# Patient Record
Sex: Female | Born: 1980 | Race: White | Hispanic: No | Marital: Single | Smoking: Current every day smoker
Health system: Southern US, Community
[De-identification: ages and names within clinical notes are randomized; demographics above are authoritative.]

## PROBLEM LIST (undated history)

## (undated) DIAGNOSIS — J45909 Unspecified asthma, uncomplicated: Secondary | ICD-10-CM

## (undated) DIAGNOSIS — N83209 Unspecified ovarian cyst, unspecified side: Secondary | ICD-10-CM

## (undated) DIAGNOSIS — K219 Gastro-esophageal reflux disease without esophagitis: Secondary | ICD-10-CM

## (undated) DIAGNOSIS — M797 Fibromyalgia: Secondary | ICD-10-CM

## (undated) DIAGNOSIS — F988 Other specified behavioral and emotional disorders with onset usually occurring in childhood and adolescence: Secondary | ICD-10-CM

## (undated) DIAGNOSIS — F419 Anxiety disorder, unspecified: Secondary | ICD-10-CM

## (undated) DIAGNOSIS — M545 Low back pain, unspecified: Secondary | ICD-10-CM

## (undated) DIAGNOSIS — N83519 Torsion of ovary and ovarian pedicle, unspecified side: Secondary | ICD-10-CM

## (undated) DIAGNOSIS — M5030 Other cervical disc degeneration, unspecified cervical region: Secondary | ICD-10-CM

## (undated) HISTORY — DX: Unspecified asthma, uncomplicated: J45.909

## (undated) HISTORY — DX: Unspecified ovarian cyst, unspecified side: N83.209

## (undated) HISTORY — DX: Other specified behavioral and emotional disorders with onset usually occurring in childhood and adolescence: F98.8

## (undated) HISTORY — DX: Other cervical disc degeneration, unspecified cervical region: M50.30

## (undated) HISTORY — PX: OOPHORECTOMY: SHX86

## (undated) HISTORY — PX: HAND SURGERY: SHX662

## (undated) HISTORY — PX: TUBAL LIGATION: SHX77

---

## 2017-05-24 ENCOUNTER — Emergency Department (HOSPITAL_COMMUNITY): Payer: Medicare Other

## 2017-05-24 ENCOUNTER — Encounter (HOSPITAL_COMMUNITY): Payer: Self-pay | Admitting: Emergency Medicine

## 2017-05-24 ENCOUNTER — Emergency Department (HOSPITAL_COMMUNITY)
Admission: EM | Admit: 2017-05-24 | Discharge: 2017-05-24 | Disposition: A | Discharge status: Home / Self Care | Payer: Medicare Other | Attending: Emergency Medicine | Admitting: Emergency Medicine

## 2017-05-24 DIAGNOSIS — N838 Other noninflammatory disorders of ovary, fallopian tube and broad ligament: Secondary | ICD-10-CM | POA: Diagnosis not present

## 2017-05-24 DIAGNOSIS — N760 Acute vaginitis: Secondary | ICD-10-CM | POA: Insufficient documentation

## 2017-05-24 DIAGNOSIS — R1110 Vomiting, unspecified: Secondary | ICD-10-CM | POA: Diagnosis not present

## 2017-05-24 DIAGNOSIS — R1032 Left lower quadrant pain: Secondary | ICD-10-CM | POA: Diagnosis present

## 2017-05-24 DIAGNOSIS — B9689 Other specified bacterial agents as the cause of diseases classified elsewhere: Secondary | ICD-10-CM

## 2017-05-24 DIAGNOSIS — R102 Pelvic and perineal pain: Secondary | ICD-10-CM | POA: Diagnosis not present

## 2017-05-24 DIAGNOSIS — F17210 Nicotine dependence, cigarettes, uncomplicated: Secondary | ICD-10-CM | POA: Diagnosis not present

## 2017-05-24 DIAGNOSIS — N83202 Unspecified ovarian cyst, left side: Secondary | ICD-10-CM

## 2017-05-24 HISTORY — DX: Fibromyalgia: M79.7

## 2017-05-24 HISTORY — DX: Low back pain: M54.5

## 2017-05-24 HISTORY — DX: Anxiety disorder, unspecified: F41.9

## 2017-05-24 HISTORY — DX: Torsion of ovary and ovarian pedicle, unspecified side: N83.519

## 2017-05-24 HISTORY — DX: Low back pain, unspecified: M54.50

## 2017-05-24 LAB — COMPREHENSIVE METABOLIC PANEL
ALK PHOS: 51 U/L (ref 38–126)
ALT: 41 U/L (ref 14–54)
ANION GAP: 8 (ref 5–15)
AST: 34 U/L (ref 15–41)
Albumin: 3.9 g/dL (ref 3.5–5.0)
BUN: 13 mg/dL (ref 6–20)
CALCIUM: 8.9 mg/dL (ref 8.9–10.3)
CO2: 23 mmol/L (ref 22–32)
Chloride: 102 mmol/L (ref 101–111)
Creatinine, Ser: 0.8 mg/dL (ref 0.44–1.00)
GFR calc non Af Amer: >60 mL/min (ref >60)
Glucose, Bld: 97 mg/dL (ref 65–99)
Potassium: 3.7 mmol/L (ref 3.5–5.1)
SODIUM: 133 mmol/L — AB (ref 135–145)
Total Bilirubin: 0.8 mg/dL (ref 0.3–1.2)
Total Protein: 6.7 g/dL (ref 6.5–8.1)

## 2017-05-24 LAB — CBC WITH DIFFERENTIAL/PLATELET
BASOS PCT: 0 %
Basophils Absolute: 0 10*3/uL (ref 0.0–0.1)
EOS ABS: 0.2 10*3/uL (ref 0.0–0.7)
Eosinophils Relative: 3 %
HCT: 43.8 % (ref 36.0–46.0)
HEMOGLOBIN: 14.8 g/dL (ref 12.0–15.0)
LYMPHS ABS: 1.2 10*3/uL (ref 0.7–4.0)
Lymphocytes Relative: 14 %
MCH: 30.2 pg (ref 26.0–34.0)
MCHC: 33.8 g/dL (ref 30.0–36.0)
MCV: 89.4 fL (ref 78.0–100.0)
Monocytes Absolute: 0.4 10*3/uL (ref 0.1–1.0)
Monocytes Relative: 5 %
NEUTROS PCT: 78 %
Neutro Abs: 6.3 10*3/uL (ref 1.7–7.7)
Platelets: 236 10*3/uL (ref 150–400)
RBC: 4.9 MIL/uL (ref 3.87–5.11)
RDW: 14.4 % (ref 11.5–15.5)
WBC: 8.2 10*3/uL (ref 4.0–10.5)

## 2017-05-24 LAB — URINALYSIS, ROUTINE W REFLEX MICROSCOPIC
Bacteria, UA: NONE SEEN
Bilirubin Urine: NEGATIVE
GLUCOSE, UA: NEGATIVE mg/dL
Ketones, ur: NEGATIVE mg/dL
LEUKOCYTES UA: NEGATIVE
NITRITE: NEGATIVE
PH: 5 (ref 5.0–8.0)
PROTEIN: NEGATIVE mg/dL
SPECIFIC GRAVITY, URINE: 1.014 (ref 1.005–1.030)

## 2017-05-24 LAB — PREGNANCY, URINE: Preg Test, Ur: NEGATIVE

## 2017-05-24 LAB — WET PREP, GENITAL
Sperm: NONE SEEN
TRICH WET PREP: NONE SEEN
Yeast Wet Prep HPF POC: NONE SEEN

## 2017-05-24 LAB — LIPASE, BLOOD: Lipase: 21 U/L (ref 11–51)

## 2017-05-24 MED ORDER — ONDANSETRON 4 MG PO TBDP: 4.0000 mg | ORAL_TABLET | Freq: Three times a day (TID) | ORAL | 0 refills | Status: DC | PRN

## 2017-05-24 MED ORDER — FENTANYL CITRATE (PF) 100 MCG/2ML IJ SOLN
50.0000 ug | INTRAMUSCULAR | Status: AC | PRN
  Administered 2017-05-24 (×2): 50 ug via INTRAVENOUS
  Filled 2017-05-24 (×2): qty 2

## 2017-05-24 MED ORDER — HYDROCODONE-ACETAMINOPHEN 5-325 MG PO TABS: ORAL_TABLET | ORAL | 0 refills | Status: DC

## 2017-05-24 MED ORDER — ONDANSETRON HCL 4 MG/2ML IJ SOLN
4.0000 mg | INTRAMUSCULAR | Status: DC | PRN
  Administered 2017-05-24: 4 mg via INTRAVENOUS
  Filled 2017-05-24: qty 2

## 2017-05-24 MED ORDER — NAPROXEN 250 MG PO TABS: 250.0000 mg | ORAL_TABLET | Freq: Two times a day (BID) | ORAL | 0 refills | Status: DC | PRN

## 2017-05-24 MED ORDER — METRONIDAZOLE 500 MG PO TABS: 500.0000 mg | ORAL_TABLET | Freq: Two times a day (BID) | ORAL | 0 refills | Status: DC

## 2017-05-24 NOTE — ED Provider Notes (Signed)
[facility] DEPT Provider Note   CSN: [id number] Arrival date & time: 05/24/17  1223     History   Chief Complaint Chief Complaint  Patient presents with  . Abdominal Pain    HPI Emily Vasquez is a 36 y.o. female.   Abdominal Pain      Pt was seen at 1230.  Per pt, c/o gradual onset and persistence of constant left sided abd "pain" since yesterday.  Has been associated with multiple intermittent episodes of N/V.  Describes the abd pain as "sharp," with radiation into her left lower back.  Denies N/V, no diarrhea, no fevers, hematuria/dysuria, no vaginal bleeding/discharge, no rash, no CP/SOB, no black or blood in stools or emesis.      Past Medical History:  Diagnosis Date  . Anxiety   . Fibromyalgia   . Low back pain   . Ovarian torsion     There are no active problems to display for this patient.   Past Surgical History:  Procedure Laterality Date  . CESAREAN SECTION    . HAND SURGERY    . OOPHORECTOMY      OB History    No data available       Home Medications    Prior to Admission medications   Medication Sig Start Date End Date Taking? Authorizing Provider  clonazePAM (KLONOPIN) 0.5 MG tablet Take 0.5 mg by mouth 4 (four) times daily. 05/11/17  Yes [provider]  fluticasone (FLONASE) 50 MCG/ACT nasal spray Place 2 sprays into both nostrils daily.   Yes [provider]  gabapentin (NEURONTIN) 300 MG capsule Take 1 capsule by mouth 4 (four) times daily. 04/29/17  Yes [provider]  gabapentin (NEURONTIN) 600 MG tablet Take 600 mg by mouth 4 (four) times daily. 04/29/17  Yes [provider]  methylphenidate (RITALIN LA) 10 MG 24 hr capsule Take 10 mg by mouth daily.   Yes [provider]  omeprazole (PRILOSEC) 40 MG capsule Take 40 mg by mouth daily.   Yes [provider]  topiramate (TOPAMAX) 100 MG tablet Take 100 mg by mouth at bedtime. 05/07/17  Yes [provider]    Family  History History reviewed. No pertinent family history.  Social History Social History  Substance Use Topics  . Smoking status: Current Every Day Smoker    Types: Cigarettes  . Smokeless tobacco: Never Used  . Alcohol use Yes     Comment: occasional     Allergies   Compazine [prochlorperazine maleate]   Review of Systems Review of Systems  Gastrointestinal: Positive for abdominal pain.   ROS: Statement: All systems negative except as marked or noted in the HPI; Constitutional: Negative for fever and chills. ; ; Eyes: Negative for eye pain, redness and discharge. ; ; ENMT: Negative for ear pain, hoarseness, nasal congestion, sinus pressure and sore throat. ; ; Cardiovascular: Negative for chest pain, palpitations, diaphoresis, dyspnea and peripheral edema. ; ; Respiratory: Negative for cough, wheezing and stridor. ; ; Gastrointestinal: +N/V, abd pain. Negative for diarrhea, blood in stool, hematemesis, jaundice and rectal bleeding. . ; ; Genitourinary: Negative for dysuria, flank pain and hematuria. ; ; GYN:  No vaginal bleeding, no vaginal discharge, no vulvar pain. ;; Musculoskeletal: Negative for back pain and neck pain. Negative for swelling and trauma.; ; Skin: Negative for pruritus, rash, abrasions, blisters, bruising and skin lesion.; ; Neuro: Negative for headache, lightheadedness and neck stiffness. Negative for weakness, altered level of consciousness, altered mental status, extremity weakness,  paresthesias, involuntary movement, seizure and syncope.       Physical Exam Updated Vital Signs BP 121/79 (BP Location: Right Arm)   Pulse 67   Temp 98.3 F (36.8 C) (Oral)   Resp 18   Ht 5\' 11"  (1.803 m)   Wt 77.1 kg (170 lb)   LMP 05/11/2017 Comment: neg u preg  SpO2 100%   BMI 23.71 kg/m   Physical Exam 1235: Physical examination:  Nursing notes reviewed; Vital signs and O2 SAT reviewed;  Constitutional: Well developed, Well nourished, Well hydrated, In no acute distress;  Head:  Normocephalic, atraumatic; Eyes: EOMI, PERRL, No scleral icterus; ENMT: Mouth and pharynx normal, Mucous membranes moist; Neck: Supple, Full range of motion, No lymphadenopathy; Cardiovascular: Regular rate and rhythm, No gallop; Respiratory: Breath sounds clear & equal bilaterally, No wheezes.  Speaking full sentences with ease, Normal respiratory effort/excursion; Chest: Nontender, Movement normal; Abdomen: Soft, +LLQ tender to palp. No rebound or guarding. Nondistended, Normal bowel sounds; Genitourinary: No CVA tenderness. Pelvic exam performed with permission of pt and female ED tech assist during exam.  External genitalia w/o lesions. Vaginal vault with thick white discharge, no blood.  Cervix w/o lesions, not friable, GC/chlam and wet prep obtained and sent to lab.  Bimanual exam w/o CMT, uterine or right adnexal tenderness. +mild left pelvic tenderness.;;; Spine:  No midline CS, TS, LS tenderness.;; Extremities: Pulses normal, No tenderness, No edema, No calf edema or asymmetry.; Neuro: AA&Ox3, Major CN grossly intact.  Speech clear. No gross focal motor or sensory deficits in extremities.; Skin: Color normal, Warm, Dry.   ED Treatments / Results  Labs (all labs ordered are listed, but only abnormal results are displayed)   EKG  EKG Interpretation None       Radiology   Procedures Procedures (including critical care time)  Medications Ordered in ED Medications  fentaNYL (SUBLIMAZE) injection 50 mcg (50 mcg Intravenous Given 05/24/17 1348)  ondansetron (ZOFRAN) injection 4 mg (not administered)     Initial Impression / Assessment and Plan / ED Course  I have reviewed the triage vital signs and the nursing notes.  Pertinent labs & imaging results that were available during my care of the patient were reviewed by me and considered in my medical decision making (see chart for details).  MDM Reviewed: nursing note and vitals Interpretation: labs, ultrasound and CT  scan   Results for orders placed or performed during the hospital encounter of 05/24/17  Wet prep, genital  Result Value Ref Range   Yeast Wet Prep HPF POC NONE SEEN NONE SEEN   Trich, Wet Prep NONE SEEN NONE SEEN   Clue Cells Wet Prep HPF POC PRESENT (A) NONE SEEN   WBC, Wet Prep HPF POC FEW (A) NONE SEEN   Sperm NONE SEEN   Comprehensive metabolic panel  Result Value Ref Range   Sodium 133 (L) 135 - 145 mmol/L   Potassium 3.7 3.5 - 5.1 mmol/L   Chloride 102 101 - 111 mmol/L   CO2 23 22 - 32 mmol/L   Glucose, Bld 97 65 - 99 mg/dL   BUN 13 6 - 20 mg/dL   Creatinine, Ser 0.80 0.44 - 1.00 mg/dL   Calcium 8.9 8.9 - 10.3 mg/dL   Total Protein 6.7 6.5 - 8.1 g/dL   Albumin 3.9 3.5 - 5.0 g/dL   AST 34 15 - 41 U/L   ALT 41 14 - 54 U/L   Alkaline Phosphatase 51 38 - 126 U/L   Total  Bilirubin 0.8 0.3 - 1.2 mg/dL   GFR calc non Af Amer >60 >60 mL/min   GFR calc Af Amer >60 >60 mL/min   Anion gap 8 5 - 15  Lipase, blood  Result Value Ref Range   Lipase 21 11 - 51 U/L  CBC with Differential  Result Value Ref Range   WBC 8.2 4.0 - 10.5 K/uL   RBC 4.90 3.87 - 5.11 MIL/uL   Hemoglobin 14.8 12.0 - 15.0 g/dL   HCT 43.8 36.0 - 46.0 %   MCV 89.4 78.0 - 100.0 fL   MCH 30.2 26.0 - 34.0 pg   MCHC 33.8 30.0 - 36.0 g/dL   RDW 14.4 11.5 - 15.5 %   Platelets 236 150 - 400 K/uL   Neutrophils Relative % 78 %   Neutro Abs 6.3 1.7 - 7.7 K/uL   Lymphocytes Relative 14 %   Lymphs Abs 1.2 0.7 - 4.0 K/uL   Monocytes Relative 5 %   Monocytes Absolute 0.4 0.1 - 1.0 K/uL   Eosinophils Relative 3 %   Eosinophils Absolute 0.2 0.0 - 0.7 K/uL   Basophils Relative 0 %   Basophils Absolute 0.0 0.0 - 0.1 K/uL  Urinalysis, Routine w reflex microscopic  Result Value Ref Range   Color, Urine YELLOW YELLOW   APPearance HAZY (A) CLEAR   Specific Gravity, Urine 1.014 1.005 - 1.030   pH 5.0 5.0 - 8.0   Glucose, UA NEGATIVE NEGATIVE mg/dL   Hgb urine dipstick MODERATE (A) NEGATIVE   Bilirubin Urine NEGATIVE  NEGATIVE   Ketones, ur NEGATIVE NEGATIVE mg/dL   Protein, ur NEGATIVE NEGATIVE mg/dL   Nitrite NEGATIVE NEGATIVE   Leukocytes, UA NEGATIVE NEGATIVE   RBC / HPF 6-30 0 - 5 RBC/hpf   WBC, UA 0-5 0 - 5 WBC/hpf   Bacteria, UA NONE SEEN NONE SEEN   Squamous Epithelial / LPF 0-5 (A) NONE SEEN   Mucous PRESENT   Pregnancy, urine  Result Value Ref Range   Preg Test, Ur NEGATIVE NEGATIVE   Ct Renal Stone Study Result Date: 05/24/2017 CLINICAL DATA:  36 year old female with history of left lower abdominal pain radiating into the back. Vomiting since yesterday evening. EXAM: CT ABDOMEN AND PELVIS WITHOUT CONTRAST TECHNIQUE: Multidetector CT imaging of the abdomen and pelvis was performed following the standard protocol without IV contrast. COMPARISON:  No priors. FINDINGS: Lower chest: Unremarkable. Hepatobiliary: Severe diffuse low attenuation throughout the hepatic parenchyma, indicative of severe hepatic steatosis. No discrete cystic or solid hepatic lesions are confidently identified on today's noncontrast CT examination. Unenhanced appearance of the gallbladder is normal. Pancreas: No definite pancreatic mass or peripancreatic inflammatory changes are noted on today's noncontrast CT examination. Spleen: Unremarkable. Adrenals/Urinary Tract: There are no abnormal calcifications within the collecting system of either kidney, along the course of either ureter, or within the lumen of the urinary bladder. No hydroureteronephrosis or perinephric stranding to suggest urinary tract obstruction at this time. The unenhanced appearance of the kidneys is unremarkable bilaterally. Unenhanced appearance of the urinary bladder is normal. Unenhanced appearance of the adrenal glands bilaterally is normal. Stomach/Bowel: Unenhanced appearance of the stomach is normal. There is no pathologic dilatation of small bowel or colon. Normal appendix. Vascular/Lymphatic: Minimal atherosclerotic calcifications are noted in the  abdominal aorta and pelvic vasculature, without definite aneurysms. No lymphadenopathy noted in the abdomen or pelvis on today's noncontrast CT examination. Reproductive: Uterus and right ovary are unremarkable in appearance. Mass-like appearance of the left adnexa which measures approximately  8.6 x 5.5 x 6.6 cm (axial image 66 of series 3 and coronal image 70 of series 6), which appears to contain several internal lesions which do not appear simple on today's noncontrast CT examination, largest of which measures approximately 3.0 x 3.5 x 4.4 cm (axial image 69 of series 3 and coronal image 76 of series 6). Other: Trace volume of free fluid in the cul-de-sac, presumably physiologic in this young female patient. No significant volume of ascites. No pneumoperitoneum. Musculoskeletal: There are no aggressive appearing lytic or blastic lesions noted in the visualized portions of the skeleton. IMPRESSION: 1. No acute findings are noted in the abdomen or pelvis to account for the patient's symptoms. 2. Mass-like enlargement of the left adnexa which appears to contain several ovarian lesions, some of which do not appear simple on today's noncontrast CT examination. Today's study is limited, and further evaluation with pelvic ultrasound is strongly recommended in the near future to better evaluate these findings and exclude underlying ovarian neoplasm or acute abnormality such as ovarian torsion. 3. Severe hepatic steatosis. Electronically Signed   By: Vinnie Langton M.D.   On: 05/24/2017 14:14   US Transvaginal Non-ob Result Date: 05/24/2017 CLINICAL DATA:  36 year old female with history of left lower quadrant abdominal pain and nausea and vomiting since yesterday evening. EXAM: TRANSABDOMINAL AND TRANSVAGINAL ULTRASOUND OF PELVIS DOPPLER ULTRASOUND OF OVARIES TECHNIQUE: Both transabdominal and transvaginal ultrasound examinations of the pelvis were performed. Transabdominal technique was performed for global imaging  of the pelvis including uterus, ovaries, adnexal regions, and pelvic cul-de-sac. It was necessary to proceed with endovaginal exam following the transabdominal exam to visualize the ovaries. Color and duplex Doppler ultrasound was utilized to evaluate blood flow to the ovaries. COMPARISON:  None. FINDINGS: Uterus Measurements: 9.1 x 7.3 x 7.1 cm. No fibroids or other mass visualized. Endometrium Thickness: 8 mm.  No focal abnormality visualized. Right ovary Status post right oophorectomy. Left ovary Measurements: 9.0 x 4.9 x 3.9 cm. Diffusely heterogeneous with multiple internal areas of heterogeneous echogenicity, likely to reflect the presence of several independent lesions, several of which are solid in appearance, but poorly evaluated on today's examination. One of these lesions measures 2.5 cm and demonstrates several thick internal septations. Pulsed Doppler evaluation of the left ovary demonstrate normal low-resistance arterial and venous waveforms. Other findings Trace volume of free fluid, likely physiologic. IMPRESSION: 1. Enlarged and heterogeneous appearing left ovary with multiple complex lesions, many of which appear partially solid or have thick internal septations. Findings are concerning for potential neoplasm and could be better evaluated with followup nonemergent MRI of the pelvis with and without IV gadolinium. 2. At this time, there is no evidence for left ovarian torsion. 3. Status post right oophorectomy. Electronically Signed   By: Vinnie Langton M.D.   On: 05/24/2017 15:52    1555:  CT and f/u US as above; no torsion or acute surgical issue at this time. Tx for BV and f/u OB/GYN MD. Dx and testing d/w pt.  Questions answered.  Verb understanding, agreeable to d/c home with outpt f/u.       Final Clinical Impressions(s) / ED Diagnoses   Final diagnoses:  Pelvic pain  Adnexal cyst  Bacterial vaginosis    New Prescriptions New Prescriptions   No medications on file       Francine Graven, DO 05/28/17 1533

## 2017-05-24 NOTE — ED Triage Notes (Signed)
Per ems, lower left abdominal pain that spreads to back. Vomiting since last night. Given 30 mg of toradol and 4 mg of zofran.

## 2017-05-24 NOTE — ED Notes (Signed)
Pt states her pain is increasing, but states she will let me know when she needs another dose of pain medication.  Call light in reach.

## 2017-05-24 NOTE — Discharge Instructions (Signed)
Take the prescriptions as directed. Your pelvic ultrasound showed: "Enlarged and heterogeneous appearing left ovary with multiple complex lesions. Findings could be better evaluated with followup nonemergent MRI of the pelvis with and without IV gadolinium."  Call the OB/GYN doctor tomorrow to schedule a follow up appointment this week for these findings.  Return to the Emergency Department immediately sooner if worsening.

## 2017-05-25 LAB — GC/CHLAMYDIA PROBE AMP (~~LOC~~) NOT AT ARMC
CHLAMYDIA, DNA PROBE: NEGATIVE
Neisseria Gonorrhea: NEGATIVE

## 2017-05-28 ENCOUNTER — Ambulatory Visit (INDEPENDENT_AMBULATORY_CARE_PROVIDER_SITE_OTHER): Payer: Medicare Other | Admitting: Adult Health

## 2017-05-28 ENCOUNTER — Encounter: Payer: Self-pay | Admitting: Adult Health

## 2017-05-28 VITALS — BP 122/64 | HR 98 | Ht 70.5 in | Wt 181.5 lb

## 2017-05-28 DIAGNOSIS — N838 Other noninflammatory disorders of ovary, fallopian tube and broad ligament: Secondary | ICD-10-CM

## 2017-05-28 DIAGNOSIS — R102 Pelvic and perineal pain: Secondary | ICD-10-CM | POA: Insufficient documentation

## 2017-05-28 DIAGNOSIS — N83299 Other ovarian cyst, unspecified side: Secondary | ICD-10-CM | POA: Diagnosis not present

## 2017-05-28 MED ORDER — KETOROLAC TROMETHAMINE 10 MG PO TABS: 10.0000 mg | ORAL_TABLET | Freq: Four times a day (QID) | ORAL | 0 refills | Status: DC | PRN

## 2017-05-28 NOTE — Progress Notes (Signed)
Subjective:     Patient ID: Emily Vasquez, female   DOB: 1981-01-18, 36 y.o.   MRN: [id number]  HPI Cheyne is a 36 year old white female in for ER follow up was seen in ER at [facility] for pelvic pain esp LLQ, started 8/11, is having bleeding now.Has right ovarian torsion and had ovary removed last year.Has 71 year old son, and can't play with him, due to pain.She says she has fibromyalgia, DJD and arthritis and is disabled. She recently moved here from [place] but is from [place].She says now has pressure and feels like contractions.  Review of Systems  +pelvic pain,esp LLQ +pressure +bleeding  Reviewed past medical,surgical, social and family history. Reviewed medications and allergies.     Objective:   Physical Exam BP 122/64 (BP Location: Left Arm, Patient Position: Sitting, Cuff Size: Normal)   Pulse 98   Ht 5' 10.5" (1.791 m)   Wt 181 lb 8 oz (82.3 kg)   LMP 05/11/2017 Comment: neg u preg  BMI 25.67 kg/m  PHQ 2 score 1. Skin warm and dry.Pelvic: external genitalia is normal in appearance no lesions, vagina:+ period like blood ,urethra has no lesions or masses noted, cervix:smooth and bulbous, no CMT noted, uterus: normal size, shape and contour, mildly tender, no masses felt, adnexa: no masses, LLQ tenderness noted. Bladder is non tender and no masses felt.  Had negative GC/CHL in ER 05/24/17 and negative UPT. US showed:Marland Kitchen Enlarged and heterogeneous appearing left ovary with multiple complex lesions, many of which appear partially solid or have thick internal septations.  2. At this time, there is no evidence for left ovarian torsion. 3. Status post right oophorectomy.   Discussed with Dr Elonda Husky, and he reviewed [place] films and he will see next week, to get CA 125.She is aware that she may need surgical intervention, will Rx Toradol for pain.   Assessment:     1. Complex ovarian cyst   2. Enlarged ovary   3. Pelvic pain       Plan:     Check CA 125, in am(has to get money  together) Return in 4 days for pre op with Dr Elonda Husky Rx toradol 10 mg #20 take 1 every 6 hours prn pain, no refills If pain changes or increase go to ER

## 2017-05-28 NOTE — Patient Instructions (Addendum)
Return in 4 days to see Dr Elonda Husky  Get CA 125 in am

## 2017-05-29 ENCOUNTER — Other Ambulatory Visit: Payer: Self-pay | Admitting: Adult Health

## 2017-05-30 LAB — CA 125: Cancer Antigen (CA) 125: 19.5 U/mL (ref 0.0–38.1)

## 2017-06-01 ENCOUNTER — Telehealth: Payer: Self-pay | Admitting: Adult Health

## 2017-06-01 ENCOUNTER — Encounter: Payer: Self-pay | Admitting: Obstetrics & Gynecology

## 2017-06-01 ENCOUNTER — Ambulatory Visit (INDEPENDENT_AMBULATORY_CARE_PROVIDER_SITE_OTHER): Payer: Medicare Other | Admitting: Obstetrics & Gynecology

## 2017-06-01 VITALS — BP 122/80 | HR 87 | Ht 70.5 in | Wt 180.0 lb

## 2017-06-01 DIAGNOSIS — C562 Malignant neoplasm of left ovary: Secondary | ICD-10-CM | POA: Diagnosis not present

## 2017-06-01 DIAGNOSIS — R102 Pelvic and perineal pain: Secondary | ICD-10-CM | POA: Diagnosis not present

## 2017-06-01 DIAGNOSIS — N838 Other noninflammatory disorders of ovary, fallopian tube and broad ligament: Secondary | ICD-10-CM

## 2017-06-01 DIAGNOSIS — N839 Noninflammatory disorder of ovary, fallopian tube and broad ligament, unspecified: Secondary | ICD-10-CM | POA: Diagnosis not present

## 2017-06-01 DIAGNOSIS — N83299 Other ovarian cyst, unspecified side: Secondary | ICD-10-CM

## 2017-06-01 NOTE — Telephone Encounter (Signed)
Pt aware that CA 125 is 19.5, no cancer.

## 2017-06-01 NOTE — Progress Notes (Signed)
Preoperative History and Physical    Preoperative History and Physical  Emily Vasquez is a 36 y.o. [id number] with No LMP recorded. admitted for a laparoscopic left salpingo oophorectomy for persistent increasingly painful left ovarian complex mass.  CA 125 is normal, pt elects for removal, has a history of right ovarian torsion last with subsequent removal  PMH:      Past Medical History:  Diagnosis Date  . ADD (attention deficit disorder)   . Anxiety   . Asthma   . Degenerative disc disease, cervical   . Fibromyalgia   . GERD (gastroesophageal reflux disease)   . Low back pain   . Ovarian cyst   . Ovarian torsion    PSH:       Past Surgical History:  Procedure Laterality Date  . CESAREAN SECTION    . HAND SURGERY    . OOPHORECTOMY    . TUBAL LIGATION     POb/GynH:          OB History     Gravida Para Term Preterm AB Living   2 2 2   2     SAB TAB Ectopic Multiple Live Births       2      SH:         Social History  Substance Use Topics  . Smoking status: Current Every Day Smoker    Packs/day: 0.50    Years: 26.00    Types: Cigarettes  . Smokeless tobacco: Never Used  . Alcohol use Yes      Comment: occasional   FH:       Family History  Problem Relation Age of Onset  . Alcohol abuse Paternal Grandfather   . Mental illness Maternal Grandmother   . Diabetes Maternal Grandmother   . Heart attack Maternal Grandfather   . Heart attack Father   . High Cholesterol Father   . Diabetes Brother   . Stroke Brother   . Asthma Daughter   . Asthma Son    Allergies:       Allergies  Allergen Reactions  . Compazine [Prochlorperazine Maleate] Other (See Comments)    Nausea and muscle spasms   Medications:  Current Facility-Administered Medications:  . bupivacaine liposome (EXPAREL) 1.3 % injection 266 mg, 20 mL, Infiltration, Once, Meerab Maselli H, MD  . ceFAZolin (ANCEF) IVPB 2g/100 mL premix, 2 g, Intravenous, On Call to OR, Florian Buff, MD  .  lactated ringers infusion, , Intravenous, Continuous, Lerry Liner, MD  Review of Systems:  Review of Systems  Constitutional: Negative for fever, chills, weight loss, malaise/fatigue and diaphoresis.  HENT: Negative for hearing loss, ear pain, nosebleeds, congestion, sore throat, neck pain, tinnitus and ear discharge.  Eyes: Negative for blurred vision, double vision, photophobia, pain, discharge and redness.  Respiratory: Negative for cough, hemoptysis, sputum production, shortness of breath, wheezing and stridor.  Cardiovascular: Negative for chest pain, palpitations, orthopnea, claudication, leg swelling and PND.  Gastrointestinal: Positive for abdominal pain. Negative for heartburn, nausea, vomiting, diarrhea, constipation, blood in stool and melena.  Genitourinary: Negative for dysuria, urgency, frequency, hematuria and flank pain.  Musculoskeletal: Negative for myalgias, back pain, joint pain and falls.  Skin: Negative for itching and rash.  Neurological: Negative for dizziness, tingling, tremors, sensory change, speech change, focal weakness, seizures, loss of consciousness, weakness and headaches.  Endo/Heme/Allergies: Negative for environmental allergies and polydipsia. Does not bruise/bleed easily.  Psychiatric/Behavioral: Negative for depression, suicidal ideas, hallucinations, memory loss and substance abuse. The patient is  not nervous/anxious and does not have insomnia.  PHYSICAL EXAM:  Blood pressure (!) 104/51, temperature 98.2 F (36.8 C), resp. rate (!) 77, SpO2 92 %.  Vitals reviewed.  Constitutional: She is oriented to person, place, and time. She appears well-developed and well-nourished.  HENT:  Head: Normocephalic and atraumatic.  Right Ear: External ear normal.  Left Ear: External ear normal.  Nose: Nose normal.  Mouth/Throat: Oropharynx is clear and moist.  Eyes: Conjunctivae and EOM are normal. Pupils are equal, round, and reactive to light. Right eye exhibits no  discharge. Left eye exhibits no discharge. No scleral icterus.  Neck: Normal range of motion. Neck supple. No tracheal deviation present. No thyromegaly present.  Cardiovascular: Normal rate, regular rhythm, normal heart sounds and intact distal pulses. Exam reveals no gallop and no friction rub.  No murmur heard.  Respiratory: Effort normal and breath sounds normal. No respiratory distress. She has no wheezes. She has no rales. She exhibits no tenderness.  GI: Soft. Bowel sounds are normal. She exhibits no distension and no mass. There is tenderness. There is no rebound and no guarding.  Genitourinary:  Vulva is normal without lesions Vagina is pink moist without discharge Cervix normal in appearance and pap is normal Uterus is normal size, contour, position, consistency, mobility, non-tender Adnexa is per sonogram Musculoskeletal: Normal range of motion. She exhibits no edema and no tenderness.  Neurological: She is alert and oriented to person, place, and time. She has normal reflexes. She displays normal reflexes. No cranial nerve deficit. She exhibits normal muscle tone. Coordination normal.  Skin: Skin is warm and dry. No rash noted. No erythema. No pallor.  Psychiatric: She has a normal mood and affect. Her behavior is normal. Judgment and thought content normal.  Labs:        Results for orders placed or performed during the hospital encounter of 06/12/17 (from the past 336 hour(s))  CBC   Collection Time: 06/12/17 2:09 PM  Result Value Ref Range   WBC 8.5 4.0 - 10.5 K/uL   RBC 4.68 3.87 - 5.11 MIL/uL   Hemoglobin 14.1 12.0 - 15.0 g/dL   HCT 41.8 36.0 - 46.0 %   MCV 89.3 78.0 - 100.0 fL   MCH 30.1 26.0 - 34.0 pg   MCHC 33.7 30.0 - 36.0 g/dL   RDW 14.1 11.5 - 15.5 %   Platelets 395 150 - 400 K/uL  Comprehensive metabolic panel   Collection Time: 06/12/17 2:09 PM  Result Value Ref Range   Sodium 140 135 - 145 mmol/L   Potassium 4.1 3.5 - 5.1 mmol/L   Chloride 109 101 - 111  mmol/L   CO2 21 (L) 22 - 32 mmol/L   Glucose, Bld 94 65 - 99 mg/dL   BUN 9 6 - 20 mg/dL   Creatinine, Ser 0.87 0.44 - 1.00 mg/dL   Calcium 9.4 8.9 - 10.3 mg/dL   Total Protein 7.0 6.5 - 8.1 g/dL   Albumin 4.1 3.5 - 5.0 g/dL   AST 33 15 - 41 U/L   ALT 39 14 - 54 U/L   Alkaline Phosphatase 63 38 - 126 U/L   Total Bilirubin 0.3 0.3 - 1.2 mg/dL   GFR calc non Af Amer >60 >60 mL/min   GFR calc Af Amer >60 >60 mL/min   Anion gap 10 5 - 15  hCG, quantitative, pregnancy   Collection Time: 06/12/17 2:09 PM  Result Value Ref Range   hCG, Beta Chain, Quant, S 1 <5  mIU/mL  Rapid HIV screen (HIV 1/2 Ab+Ag)   Collection Time: 06/12/17 2:09 PM  Result Value Ref Range   HIV-1 P24 Antigen - HIV24 NON REACTIVE NON REACTIVE   HIV 1/2 Antibodies NON REACTIVE NON REACTIVE   Interpretation (HIV Ag Ab)      A non reactive test result means that HIV 1 or HIV 2 antibodies and HIV 1 p24 antigen were not detected in the specimen.  Urinalysis, Routine w reflex microscopic   Collection Time: 06/12/17 2:09 PM  Result Value Ref Range   Color, Urine YELLOW YELLOW   APPearance HAZY (A) CLEAR   Specific Gravity, Urine 1.017 1.005 - 1.030   pH 5.0 5.0 - 8.0   Glucose, UA NEGATIVE NEGATIVE mg/dL   Hgb urine dipstick NEGATIVE NEGATIVE   Bilirubin Urine NEGATIVE NEGATIVE   Ketones, ur NEGATIVE NEGATIVE mg/dL   Protein, ur NEGATIVE NEGATIVE mg/dL   Nitrite NEGATIVE NEGATIVE   Leukocytes, UA SMALL (A) NEGATIVE   RBC / HPF 0-5 0 - 5 RBC/hpf   WBC, UA 6-30 0 - 5 WBC/hpf   Bacteria, UA RARE (A) NONE SEEN   Squamous Epithelial / LPF 0-5 (A) NONE SEEN  Type and screen   Collection Time: 06/12/17 2:09 PM  Result Value Ref Range   ABO/RH(D) O POS    Antibody Screen NEG    Sample Expiration 06/26/2017    Extend sample reason NO TRANSFUSIONS OR PREGNANCY IN THE PAST 3 MONTHS    EKG:  No orders found for this or any previous visit.  Imaging Studies:   Imaging Results     Assessment:      Patient Active  Problem List   Diagnosis Date Noted  . Complex ovarian cyst 05/28/2017  . Enlarged ovary 05/28/2017  . Pelvic pain 05/28/2017  Hx of right ovarian torsion, s/p removal last year  Normal CA 125  Plan:  Laparoscopic left salpingoophorectomy  Pt understands the risks of surgery including but not limited t excessive bleeding requiring transfusion or reoperation, post-operative infection requiring prolonged hospitalization or re-hospitalization and antibiotic therapy, and damage to other organs including bladder, bowel, ureters and major vessels. The patient also understands the alternative treatment options which were discussed in full. All questions were answered.

## 2017-06-03 ENCOUNTER — Encounter: Payer: Self-pay | Admitting: Obstetrics and Gynecology

## 2017-06-05 NOTE — Patient Instructions (Signed)
Emily Vasquez  06/05/2017     @PREFPERIOPPHARMACY @   Your procedure is scheduled on  06/17/2017 .  Report to [facility] at  950  A.M.  Call this number if you have problems the morning of surgery:  [phone]   Remember:  Do not eat food or drink liquids after midnight.  Take these medicines the morning of surgery with A SIP OF WATER  Klonopin, neurontin, toradol, ritalin, prilosec.   Do not wear jewelry, make-up or nail polish.  Do not wear lotions, powders, or perfumes, or deoderant.  Do not shave 48 hours prior to surgery.  Men may shave face and neck.  Do not bring valuables to the hospital.  [facility] is not responsible for any belongings or valuables.  Contacts, dentures or bridgework may not be worn into surgery.  Leave your suitcase in the car.  After surgery it may be brought to your room.  For patients admitted to the hospital, discharge time will be determined by your treatment team.  Patients discharged the day of surgery will not be allowed to drive home.   Name and phone number of your driver:   family Special instructions:  None  Please read over the following fact sheets that you were given. Anesthesia Post-op Instructions and Care and Recovery After Surgery       Unilateral Salpingo-Oophorectomy Unilateral salpingo-oophorectomy is the surgical removal of one fallopian tube and ovary. The ovaries are small organs that produce eggs in women. The fallopian tubes transport the egg from the ovary to the womb (uterus). A unilateral salpingo-oophorectomy may be done for various reasons, including:  Infection in the fallopian tube and ovary.  Scar tissue in the fallopian tube and ovary (adhesions).  A cyst or tumor on the ovary.  A need to remove the fallopian tube and ovary when removing the uterus.  Cancer of the fallopian tube or ovary.  The removal of one fallopian tube and ovary will not prevent you from becoming pregnant, put  you into menopause, or cause problems with your menstrual periods or sex drive. Tell a health care provider about:  Any allergies you have.  All medicines you are taking, including vitamins, herbs, eye drops, creams, and over-the-counter medicines.  Previous problems you or family members have had with the use of anesthetics.  Any blood disorders you have.  Previous surgeries you have had.  Any medical conditions you have. What are the risks? Generally, this is a safe procedure. However, as with any procedure, complications can occur. Possible complications include:  Injury to surrounding organs.  Bleeding.  Infection.  Blood clots in the legs or lungs.  Problems related to anesthesia.  What happens before the procedure?  Ask your health care provider about changing or stopping your regular medicines. You may need to stop taking certain medicines, such as aspirin or blood thinners, at least 1 week before the surgery.  Do not eat or drink anything for at least 8 hours before the surgery.  If you smoke, do not smoke for at least 2 weeks before the surgery.  Make plans to have someone drive you home after the procedure or after your hospital stay. Also arrange for someone to help you with activities during recovery. What happens during the procedure?  You will be given medicine to help you relax before the procedure (sedative). You will then be given medicine to make you sleep through the procedure (  general anesthetic). These medicines will be given through an IV access tube that is put into one of your veins.  Once you are asleep, your lower abdomen will be shaved and cleaned. A thin, flexible tube (catheter) will be placed in your bladder.  The surgeon may use a laparoscopic, robotic, or open technique for this surgery: ? In the laparoscopic technique, the surgery is done through two small cuts (incisions) in the abdomen. A thin, lighted tube with a tiny camera on the end  (laparoscope) is inserted into one of the incisions. The tools needed for the procedure are put through the other incision. ? A robotic technique may be chosen to perform complex surgery in a small space. In the robotic technique, small incisions are made. A camera and surgical instruments are passed through the incisions. Surgical instruments are controlled with the help of a robotic arm. ? In the open technique, the surgery is done through one large incision in the abdomen.  Using any of these techniques, the surgeon will remove the fallopian tube and ovary. The blood vessels will be clamped and tied.  The surgeon will then use staples or stitches to close the incision or incisions. What happens after the procedure?  You will be taken to a recovery area where your progress will be monitored for 1-3 hours. Your blood pressure, pulse, and temperature will be checked often. You will remain in the recovery area until you are stable and waking up.  If the laparoscopic technique was used, you may be allowed to go home after several hours. You may have some shoulder pain. This is normal and usually goes away in a day or two.  If the open technique was used, you will be admitted to the hospital for a couple of days.  You will be given pain medicine as necessary.  The IV tube and catheter will be removed before you are discharged. This information is not intended to replace advice given to you by your health care provider. Make sure you discuss any questions you have with your health care provider. Document Released: 07/27/2009 Document Revised: 08/29/2016 Document Reviewed: 03/23/2013 Elsevier Interactive Patient Education  2018 [place]. Unilateral Salpingo-Oophorectomy, Care After Refer to this sheet in the next few weeks. These instructions provide you with information on caring for yourself after your procedure. Your health care provider may also give you more specific instructions. Your  treatment has been planned according to current medical practices, but problems sometimes occur. Call your health care provider if you have any problems or questions after your procedure. What can I expect after the procedure? After the procedure, it is typical to have the following:  Abdominal pain that can be controlled with pain medicine.  Vaginal spotting.  Constipation.  Follow these instructions at home:  Get plenty of rest and sleep.  Only take over-the-counter or prescription medicines as directed by your health care provider. Do not take aspirin. It can cause bleeding.  Keep incision areas clean and dry. Remove or change any bandages (dressings) only as directed by your health care provider.  Follow your health care provider's advice regarding diet.  Drink enough fluids to keep your urine clear or pale yellow.  Limit exercise and activities as directed by your health care provider. Do not lift anything heavier than 5 pounds (2.3 kg) until your health care provider approves.  Do not drive until your health care provider approves.  Do not drink alcohol until your health care provider approves.  Do not have sexual intercourse until your health care provider says it is OK.  Take your temperature twice a day and write it down.  If you become constipated, you may: ? Ask your health care provider about taking a mild laxative. ? Add more fruit and bran to your diet. ? Drink more fluids.  Follow up with your health care provider as directed. Contact a health care provider if:  You have swelling or redness in the incision area.  You develop a rash.  You feel lightheaded.  You have pain that is not controlled with medicine.  You have pain, swelling, or redness where the IV access tube was placed. Get help right away if:  You have a fever.  You develop increasing abdominal pain.  You see pus coming out of the incision, or the incision is separating.  You notice a  bad smell coming from the wound or dressing.  You have excessive vaginal bleeding.  You feel sick to your stomach (nauseous) and vomit.  You have leg or chest pain.  You have pain when you urinate.  You develop shortness of breath.  You pass out. This information is not intended to replace advice given to you by your health care provider. Make sure you discuss any questions you have with your health care provider. Document Released: 07/26/2009 Document Revised: 08/29/2016 Document Reviewed: 03/23/2013 Elsevier Interactive Patient Education  2018 [employer].  Diagnostic Laparoscopy, Care After Refer to this sheet in the next few weeks. These instructions provide you with information about caring for yourself after your procedure. Your health care provider may also give you more specific instructions. Your treatment has been planned according to current medical practices, but problems sometimes occur. Call your health care provider if you have any problems or questions after your procedure. What can I expect after the procedure? After your procedure, it is common to have mild discomfort in the throat and abdomen. Follow these instructions at home:  Take over-the-counter and prescription medicines only as told by your health care provider.  Do not drive for 24 hours if you received a sedative.  Return to your normal activities as told by your health care provider.  Do not take baths, swim, or use a hot tub until your health care provider approves. You may shower.  Follow instructions from your health care provider about how to take care of your incision. Make sure you: ? Wash your hands with soap and water before you change your bandage (dressing). If soap and water are not available, use hand sanitizer. ? Change your dressing as told by your health care provider. ? Leave stitches (sutures), skin glue, or adhesive strips in place. These skin closures may need to stay in place for 2  weeks or longer. If adhesive strip edges start to loosen and curl up, you may trim the loose edges. Do not remove adhesive strips completely unless your health care provider tells you to do that.  Check your incision area every day for signs of infection. Check for: ? More redness, swelling, or pain. ? More fluid or blood. ? Warmth. ? Pus or a bad smell.  It is your responsibility to get the results of your procedure. Ask your health care provider or the department performing the procedure when your results will be ready. Contact a health care provider if:  There is new pain in your shoulders.  You feel light-headed or faint.  You are unable to pass gas or unable to  have a bowel movement.  You feel nauseous or you vomit.  You develop a rash.  You have more redness, swelling, or pain around your incision.  You have more fluid or blood coming from your incision.  Your incision feels warm to the touch.  You have pus or a bad smell coming from your incision.  You have a fever or chills. Get help right away if:  Your pain is getting worse.  You have ongoing vomiting.  The edges of your incision open up.  You have trouble breathing.  You have chest pain. This information is not intended to replace advice given to you by your health care provider. Make sure you discuss any questions you have with your health care provider. Document Released: 09/10/2015 Document Revised: 03/06/2016 Document Reviewed: 06/12/2015 Elsevier Interactive Patient Education  2018 [place] Anesthesia, Adult General anesthesia is the use of medicines to make a person "go to sleep" (be unconscious) for a medical procedure. General anesthesia is often recommended when a procedure:  Is long.  Requires you to be still or in an unusual position.  Is major and can cause you to lose blood.  Is impossible to do without general anesthesia.  The medicines used for general anesthesia are  called general anesthetics. In addition to making you sleep, the medicines:  Prevent pain.  Control your blood pressure.  Relax your muscles.  Tell a health care provider about:  Any allergies you have.  All medicines you are taking, including vitamins, herbs, eye drops, creams, and over-the-counter medicines.  Any problems you or family members have had with anesthetic medicines.  Types of anesthetics you have had in the past.  Any bleeding disorders you have.  Any surgeries you have had.  Any medical conditions you have.  Any history of heart or lung conditions, such as heart failure, sleep apnea, or chronic obstructive pulmonary disease (COPD).  Whether you are pregnant or may be pregnant.  Whether you use tobacco, alcohol, marijuana, or street drugs.  Any history of Armed forces logistics/support/administrative officer.  Any history of depression or anxiety. What are the risks? Generally, this is a safe procedure. However, problems may occur, including:  Allergic reaction to anesthetics.  Lung and heart problems.  Inhaling food or liquids from your stomach into your lungs (aspiration).  Injury to nerves.  Waking up during your procedure and being unable to move (rare).  Extreme agitation or a state of mental confusion (delirium) when you wake up from the anesthetic.  Air in the bloodstream, which can lead to stroke.  These problems are more likely to develop if you are having a major surgery or if you have an advanced medical condition. You can prevent some of these complications by answering all of your health care provider's questions thoroughly and by following all pre-procedure instructions. General anesthesia can cause side effects, including:  Nausea or vomiting  A sore throat from the breathing tube.  Feeling cold or shivery.  Feeling tired, washed out, or achy.  Sleepiness or drowsiness.  Confusion or agitation.  What happens before the procedure? Staying hydrated Follow  instructions from your health care provider about hydration, which may include:  Up to 2 hours before the procedure - you may continue to drink clear liquids, such as water, clear fruit juice, black coffee, and plain tea.  Eating and drinking restrictions Follow instructions from your health care provider about eating and drinking, which may include:  8 hours before the procedure - stop  eating heavy meals or foods such as meat, fried foods, or fatty foods.  6 hours before the procedure - stop eating light meals or foods, such as toast or cereal.  6 hours before the procedure - stop drinking milk or drinks that contain milk.  2 hours before the procedure - stop drinking clear liquids.  Medicines  Ask your health care provider about: ? Changing or stopping your regular medicines. This is especially important if you are taking diabetes medicines or blood thinners. ? Taking medicines such as aspirin and ibuprofen. These medicines can thin your blood. Do not take these medicines before your procedure if your health care provider instructs you not to. ? Taking new dietary supplements or medicines. Do not take these during the week before your procedure unless your health care provider approves them.  If you are told to take a medicine or to continue taking a medicine on the day of the procedure, take the medicine with sips of water. General instructions   Ask if you will be going home the same day, the following day, or after a longer hospital stay. ? Plan to have someone take you home. ? Plan to have someone stay with you for the first 24 hours after you leave the hospital or clinic.  For 3-6 weeks before the procedure, try not to use any tobacco products, such as cigarettes, chewing tobacco, and e-cigarettes.  You may brush your teeth on the morning of the procedure, but make sure to spit out the toothpaste. What happens during the procedure?  You will be given anesthetics through a  mask and through an IV tube in one of your veins.  You may receive medicine to help you relax (sedative).  As soon as you are asleep, a breathing tube may be used to help you breathe.  An anesthesia specialist will stay with you throughout the procedure. He or she will help keep you comfortable and safe by continuing to give you medicines and adjusting the amount of medicine that you get. He or she will also watch your blood pressure, pulse, and oxygen levels to make sure that the anesthetics do not cause any problems.  If a breathing tube was used to help you breathe, it will be removed before you wake up. The procedure may vary among health care providers and hospitals. What happens after the procedure?  You will wake up, often slowly, after the procedure is complete, usually in a recovery area.  Your blood pressure, heart rate, breathing rate, and blood oxygen level will be monitored until the medicines you were given have worn off.  You may be given medicine to help you calm down if you feel anxious or agitated.  If you will be going home the same day, your health care provider may check to make sure you can stand, drink, and urinate.  Your health care providers will treat your pain and side effects before you go home.  Do not drive for 24 hours if you received a sedative.  You may: ? Feel nauseous and vomit. ? Have a sore throat. ? Have mental slowness. ? Feel cold or shivery. ? Feel sleepy. ? Feel tired. ? Feel sore or achy, even in parts of your body where you did not have surgery. This information is not intended to replace advice given to you by your health care provider. Make sure you discuss any questions you have with your health care provider. Document Released: 01/06/2008 Document Revised: 03/11/2016 Document Reviewed:  09/13/2015 Elsevier Interactive Patient Education  2018 [place] Anesthesia, Adult, Care After These instructions provide you with  information about caring for yourself after your procedure. Your health care provider may also give you more specific instructions. Your treatment has been planned according to current medical practices, but problems sometimes occur. Call your health care provider if you have any problems or questions after your procedure. What can I expect after the procedure? After the procedure, it is common to have:  Vomiting.  A sore throat.  Mental slowness.  It is common to feel:  Nauseous.  Cold or shivery.  Sleepy.  Tired.  Sore or achy, even in parts of your body where you did not have surgery.  Follow these instructions at home: For at least 24 hours after the procedure:  Do not: ? Participate in activities where you could fall or become injured. ? Drive. ? Use heavy machinery. ? Drink alcohol. ? Take sleeping pills or medicines that cause drowsiness. ? Make important decisions or sign legal documents. ? Take care of children on your own.  Rest. Eating and drinking  If you vomit, drink water, juice, or soup when you can drink without vomiting.  Drink enough fluid to keep your urine clear or pale yellow.  Make sure you have little or no nausea before eating solid foods.  Follow the diet recommended by your health care provider. General instructions  Have a responsible adult stay with you until you are awake and alert.  Return to your normal activities as told by your health care provider. Ask your health care provider what activities are safe for you.  Take over-the-counter and prescription medicines only as told by your health care provider.  If you smoke, do not smoke without supervision.  Keep all follow-up visits as told by your health care provider. This is important. Contact a health care provider if:  You continue to have nausea or vomiting at home, and medicines are not helpful.  You cannot drink fluids or start eating again.  You cannot urinate after  8-12 hours.  You develop a skin rash.  You have fever.  You have increasing redness at the site of your procedure. Get help right away if:  You have difficulty breathing.  You have chest pain.  You have unexpected bleeding.  You feel that you are having a life-threatening or urgent problem. This information is not intended to replace advice given to you by your health care provider. Make sure you discuss any questions you have with your health care provider. Document Released: 01/05/2001 Document Revised: 03/03/2016 Document Reviewed: 09/13/2015 Elsevier Interactive Patient Education  [employer].

## 2017-06-10 ENCOUNTER — Other Ambulatory Visit: Payer: Self-pay | Admitting: Obstetrics & Gynecology

## 2017-06-10 ENCOUNTER — Encounter (HOSPITAL_COMMUNITY)
Admission: RE | Admit: 2017-06-10 | Discharge: 2017-06-10 | Disposition: A | Discharge status: Home / Self Care | Payer: Medicare Other | Source: Ambulatory Visit | Attending: Obstetrics & Gynecology | Admitting: Obstetrics & Gynecology

## 2017-06-10 DIAGNOSIS — N838 Other noninflammatory disorders of ovary, fallopian tube and broad ligament: Secondary | ICD-10-CM | POA: Insufficient documentation

## 2017-06-10 DIAGNOSIS — Z01812 Encounter for preprocedural laboratory examination: Secondary | ICD-10-CM | POA: Insufficient documentation

## 2017-06-10 DIAGNOSIS — N83299 Other ovarian cyst, unspecified side: Secondary | ICD-10-CM | POA: Insufficient documentation

## 2017-06-10 DIAGNOSIS — R102 Pelvic and perineal pain: Secondary | ICD-10-CM | POA: Insufficient documentation

## 2017-06-12 ENCOUNTER — Encounter (HOSPITAL_COMMUNITY)
Admission: RE | Admit: 2017-06-12 | Discharge: 2017-06-12 | Disposition: A | Discharge status: Home / Self Care | Payer: Medicare Other | Source: Ambulatory Visit | Attending: Obstetrics & Gynecology | Admitting: Obstetrics & Gynecology

## 2017-06-12 ENCOUNTER — Encounter (HOSPITAL_COMMUNITY): Payer: Self-pay

## 2017-06-12 DIAGNOSIS — N83299 Other ovarian cyst, unspecified side: Secondary | ICD-10-CM | POA: Diagnosis not present

## 2017-06-12 DIAGNOSIS — Z01812 Encounter for preprocedural laboratory examination: Secondary | ICD-10-CM | POA: Diagnosis present

## 2017-06-12 DIAGNOSIS — N838 Other noninflammatory disorders of ovary, fallopian tube and broad ligament: Secondary | ICD-10-CM | POA: Diagnosis not present

## 2017-06-12 DIAGNOSIS — R102 Pelvic and perineal pain: Secondary | ICD-10-CM | POA: Diagnosis not present

## 2017-06-12 HISTORY — DX: Gastro-esophageal reflux disease without esophagitis: K21.9

## 2017-06-12 LAB — COMPREHENSIVE METABOLIC PANEL
ALBUMIN: 4.1 g/dL (ref 3.5–5.0)
ALK PHOS: 63 U/L (ref 38–126)
ALT: 39 U/L (ref 14–54)
AST: 33 U/L (ref 15–41)
Anion gap: 10 (ref 5–15)
BILIRUBIN TOTAL: 0.3 mg/dL (ref 0.3–1.2)
BUN: 9 mg/dL (ref 6–20)
CALCIUM: 9.4 mg/dL (ref 8.9–10.3)
CO2: 21 mmol/L — AB (ref 22–32)
CREATININE: 0.87 mg/dL (ref 0.44–1.00)
Chloride: 109 mmol/L (ref 101–111)
GFR calc Af Amer: >60 mL/min (ref >60)
GFR calc non Af Amer: >60 mL/min (ref >60)
Glucose, Bld: 94 mg/dL (ref 65–99)
Potassium: 4.1 mmol/L (ref 3.5–5.1)
SODIUM: 140 mmol/L (ref 135–145)
TOTAL PROTEIN: 7 g/dL (ref 6.5–8.1)

## 2017-06-12 LAB — HCG, QUANTITATIVE, PREGNANCY: hCG, Beta Chain, Quant, S: 1 m[IU]/mL (ref <5)

## 2017-06-12 LAB — RAPID HIV SCREEN (HIV 1/2 AB+AG)
HIV 1/2 Antibodies: NONREACTIVE
HIV-1 P24 Antigen - HIV24: NONREACTIVE

## 2017-06-12 LAB — URINALYSIS, ROUTINE W REFLEX MICROSCOPIC
BILIRUBIN URINE: NEGATIVE
GLUCOSE, UA: NEGATIVE mg/dL
HGB URINE DIPSTICK: NEGATIVE
Ketones, ur: NEGATIVE mg/dL
NITRITE: NEGATIVE
PH: 5 (ref 5.0–8.0)
Protein, ur: NEGATIVE mg/dL
SPECIFIC GRAVITY, URINE: 1.017 (ref 1.005–1.030)

## 2017-06-12 LAB — CBC
HCT: 41.8 % (ref 36.0–46.0)
HEMOGLOBIN: 14.1 g/dL (ref 12.0–15.0)
MCH: 30.1 pg (ref 26.0–34.0)
MCHC: 33.7 g/dL (ref 30.0–36.0)
MCV: 89.3 fL (ref 78.0–100.0)
Platelets: 395 10*3/uL (ref 150–400)
RBC: 4.68 MIL/uL (ref 3.87–5.11)
RDW: 14.1 % (ref 11.5–15.5)
WBC: 8.5 10*3/uL (ref 4.0–10.5)

## 2017-06-13 LAB — TYPE AND SCREEN
ABO/RH(D): O POS
ANTIBODY SCREEN: NEGATIVE

## 2017-06-17 ENCOUNTER — Encounter (HOSPITAL_COMMUNITY): Payer: Self-pay | Admitting: Anesthesiology

## 2017-06-17 ENCOUNTER — Ambulatory Visit (HOSPITAL_COMMUNITY): Payer: Medicare Other | Admitting: Anesthesiology

## 2017-06-17 ENCOUNTER — Inpatient Hospital Stay (HOSPITAL_COMMUNITY)
Admission: RE | Admit: 2017-06-17 | Discharge: 2017-06-18 | DRG: 743 | Disposition: A | Discharge status: Home / Self Care | Payer: Medicare Other | Source: Ambulatory Visit | Attending: Obstetrics & Gynecology | Admitting: Obstetrics & Gynecology

## 2017-06-17 ENCOUNTER — Encounter (HOSPITAL_COMMUNITY)
Admission: RE | Disposition: A | Discharge status: Home / Self Care | Payer: Self-pay | Source: Ambulatory Visit | Attending: Obstetrics & Gynecology

## 2017-06-17 DIAGNOSIS — N83299 Other ovarian cyst, unspecified side: Secondary | ICD-10-CM | POA: Diagnosis present

## 2017-06-17 DIAGNOSIS — N839 Noninflammatory disorder of ovary, fallopian tube and broad ligament, unspecified: Principal | ICD-10-CM | POA: Diagnosis present

## 2017-06-17 DIAGNOSIS — N7092 Oophoritis, unspecified: Secondary | ICD-10-CM

## 2017-06-17 DIAGNOSIS — N941 Unspecified dyspareunia: Secondary | ICD-10-CM | POA: Diagnosis present

## 2017-06-17 DIAGNOSIS — K660 Peritoneal adhesions (postprocedural) (postinfection): Secondary | ICD-10-CM | POA: Diagnosis present

## 2017-06-17 DIAGNOSIS — J45909 Unspecified asthma, uncomplicated: Secondary | ICD-10-CM | POA: Diagnosis present

## 2017-06-17 DIAGNOSIS — N7093 Salpingitis and oophoritis, unspecified: Secondary | ICD-10-CM | POA: Diagnosis present

## 2017-06-17 DIAGNOSIS — Z90721 Acquired absence of ovaries, unilateral: Secondary | ICD-10-CM

## 2017-06-17 DIAGNOSIS — Z8742 Personal history of other diseases of the female genital tract: Secondary | ICD-10-CM | POA: Diagnosis not present

## 2017-06-17 DIAGNOSIS — Z5331 Laparoscopic surgical procedure converted to open procedure: Secondary | ICD-10-CM

## 2017-06-17 DIAGNOSIS — Z888 Allergy status to other drugs, medicaments and biological substances status: Secondary | ICD-10-CM

## 2017-06-17 DIAGNOSIS — N72 Inflammatory disease of cervix uteri: Secondary | ICD-10-CM | POA: Diagnosis not present

## 2017-06-17 DIAGNOSIS — Z98891 History of uterine scar from previous surgery: Secondary | ICD-10-CM

## 2017-06-17 DIAGNOSIS — N83202 Unspecified ovarian cyst, left side: Secondary | ICD-10-CM

## 2017-06-17 DIAGNOSIS — N840 Polyp of corpus uteri: Secondary | ICD-10-CM | POA: Diagnosis not present

## 2017-06-17 DIAGNOSIS — F17210 Nicotine dependence, cigarettes, uncomplicated: Secondary | ICD-10-CM | POA: Diagnosis present

## 2017-06-17 DIAGNOSIS — D390 Neoplasm of uncertain behavior of uterus: Secondary | ICD-10-CM

## 2017-06-17 DIAGNOSIS — Z90710 Acquired absence of both cervix and uterus: Secondary | ICD-10-CM | POA: Diagnosis present

## 2017-06-17 DIAGNOSIS — N879 Dysplasia of cervix uteri, unspecified: Secondary | ICD-10-CM | POA: Diagnosis not present

## 2017-06-17 DIAGNOSIS — N7013 Chronic salpingitis and oophoritis: Secondary | ICD-10-CM

## 2017-06-17 HISTORY — PX: ABDOMINAL HYSTERECTOMY: SHX81

## 2017-06-17 HISTORY — PX: LAPAROSCOPIC SALPINGO OOPHERECTOMY: SHX5927

## 2017-06-17 SURGERY — SALPINGO-OOPHORECTOMY, LAPAROSCOPIC
Anesthesia: General | Site: Abdomen | Laterality: Left

## 2017-06-17 MED ORDER — FENTANYL CITRATE (PF) 100 MCG/2ML IJ SOLN: 25.0000 ug | INTRAMUSCULAR | Status: DC | PRN

## 2017-06-17 MED ORDER — FENTANYL CITRATE (PF) 250 MCG/5ML IJ SOLN
INTRAMUSCULAR | Status: AC
  Filled 2017-06-17: qty 5

## 2017-06-17 MED ORDER — ROCURONIUM 10MG/ML (10ML) SYRINGE FOR MEDFUSION PUMP - OPTIME: INTRAVENOUS | Status: DC | PRN

## 2017-06-17 MED ORDER — GLYCOPYRROLATE 0.2 MG/ML IJ SOLN
INTRAMUSCULAR | Status: DC | PRN
  Administered 2017-06-17: 0.6 mg via INTRAVENOUS

## 2017-06-17 MED ORDER — CLONAZEPAM 0.5 MG PO TABS
0.5000 mg | ORAL_TABLET | Freq: Four times a day (QID) | ORAL | Status: DC
  Administered 2017-06-17 – 2017-06-18 (×3): 0.5 mg via ORAL
  Filled 2017-06-17 (×3): qty 1

## 2017-06-17 MED ORDER — MIDAZOLAM HCL 2 MG/2ML IJ SOLN
INTRAMUSCULAR | Status: AC
  Filled 2017-06-17: qty 2

## 2017-06-17 MED ORDER — ALBUTEROL SULFATE (2.5 MG/3ML) 0.083% IN NEBU: 2.5000 mg | INHALATION_SOLUTION | Freq: Four times a day (QID) | RESPIRATORY_TRACT | Status: DC | PRN

## 2017-06-17 MED ORDER — GABAPENTIN 600 MG PO TABS: 600.0000 mg | ORAL_TABLET | Freq: Four times a day (QID) | ORAL | Status: DC

## 2017-06-17 MED ORDER — ROCURONIUM BROMIDE 100 MG/10ML IV SOLN
INTRAVENOUS | Status: DC | PRN
  Administered 2017-06-17 (×2): 10 mg via INTRAVENOUS
  Administered 2017-06-17: 25 mg via INTRAVENOUS
  Administered 2017-06-17 (×2): 10 mg via INTRAVENOUS
  Administered 2017-06-17: 5 mg via INTRAVENOUS

## 2017-06-17 MED ORDER — ROCURONIUM BROMIDE 50 MG/5ML IV SOLN
INTRAVENOUS | Status: AC
  Filled 2017-06-17: qty 3

## 2017-06-17 MED ORDER — LIDOCAINE HCL (CARDIAC) 10 MG/ML IV SOLN
INTRAVENOUS | Status: DC | PRN
  Administered 2017-06-17: 50 mg via INTRAVENOUS

## 2017-06-17 MED ORDER — ONDANSETRON HCL 4 MG PO TABS
8.0000 mg | ORAL_TABLET | Freq: Four times a day (QID) | ORAL | Status: DC | PRN
Start: 2017-06-17 — End: 2017-06-18
  Administered 2017-06-17: 8 mg via ORAL
  Filled 2017-06-17: qty 2
  Filled 2017-06-17: qty 1

## 2017-06-17 MED ORDER — LEVOFLOXACIN IN D5W 750 MG/150ML IV SOLN
750.0000 mg | Freq: Once | INTRAVENOUS | Status: AC
  Administered 2017-06-17: 750 mg via INTRAVENOUS
  Filled 2017-06-17: qty 150

## 2017-06-17 MED ORDER — ONDANSETRON HCL 4 MG/2ML IJ SOLN
4.0000 mg | Freq: Once | INTRAMUSCULAR | Status: AC
  Administered 2017-06-17: 4 mg via INTRAVENOUS

## 2017-06-17 MED ORDER — EPHEDRINE SULFATE 50 MG/ML IJ SOLN
INTRAMUSCULAR | Status: AC
  Filled 2017-06-17: qty 1

## 2017-06-17 MED ORDER — KETOROLAC TROMETHAMINE 30 MG/ML IJ SOLN
INTRAMUSCULAR | Status: AC
  Filled 2017-06-17: qty 1

## 2017-06-17 MED ORDER — KCL IN DEXTROSE-NACL 20-5-0.45 MEQ/L-%-% IV SOLN
INTRAVENOUS | Status: DC
  Administered 2017-06-17 – 2017-06-18 (×2): via INTRAVENOUS

## 2017-06-17 MED ORDER — SODIUM CHLORIDE 0.9 % IR SOLN
Status: DC | PRN
  Administered 2017-06-17: 1000 mL
  Administered 2017-06-17: 2000 mL

## 2017-06-17 MED ORDER — LACTATED RINGERS IV SOLN
INTRAVENOUS | Status: DC
  Administered 2017-06-17 (×3): via INTRAVENOUS

## 2017-06-17 MED ORDER — HYDROMORPHONE HCL 1 MG/ML IJ SOLN
1.0000 mg | INTRAMUSCULAR | Status: DC | PRN
  Administered 2017-06-17 (×2): 1 mg via INTRAVENOUS
  Filled 2017-06-17 (×2): qty 1

## 2017-06-17 MED ORDER — ALBUTEROL SULFATE HFA 108 (90 BASE) MCG/ACT IN AERS: 2.0000 | INHALATION_SPRAY | Freq: Four times a day (QID) | RESPIRATORY_TRACT | Status: DC | PRN

## 2017-06-17 MED ORDER — BUPIVACAINE LIPOSOME 1.3 % IJ SUSP
INTRAMUSCULAR | Status: AC
  Filled 2017-06-17: qty 20

## 2017-06-17 MED ORDER — ZOLPIDEM TARTRATE 10 MG PO TABS: 10.0000 mg | ORAL_TABLET | Freq: Every evening | ORAL | Status: DC | PRN

## 2017-06-17 MED ORDER — SIMETHICONE 80 MG PO CHEW
80.0000 mg | CHEWABLE_TABLET | Freq: Four times a day (QID) | ORAL | Status: DC | PRN
  Administered 2017-06-18: 80 mg via ORAL
  Filled 2017-06-17: qty 1

## 2017-06-17 MED ORDER — BISACODYL 10 MG RE SUPP: 10.0000 mg | Freq: Every day | RECTAL | Status: DC | PRN

## 2017-06-17 MED ORDER — OXYCODONE-ACETAMINOPHEN 5-325 MG PO TABS
1.0000 | ORAL_TABLET | ORAL | Status: DC | PRN
  Administered 2017-06-17: 2 via ORAL
  Administered 2017-06-18: 1 via ORAL
  Administered 2017-06-18 (×2): 2 via ORAL
  Filled 2017-06-17 (×4): qty 2

## 2017-06-17 MED ORDER — TOPIRAMATE 100 MG PO TABS
100.0000 mg | ORAL_TABLET | Freq: Every day | ORAL | Status: DC
  Administered 2017-06-17: 100 mg via ORAL
  Filled 2017-06-17: qty 1

## 2017-06-17 MED ORDER — BUPIVACAINE LIPOSOME 1.3 % IJ SUSP
INTRAMUSCULAR | Status: DC | PRN
  Administered 2017-06-17: 20 mL

## 2017-06-17 MED ORDER — FLUTICASONE PROPIONATE 50 MCG/ACT NA SUSP: 2.0000 | Freq: Every day | NASAL | Status: DC | PRN

## 2017-06-17 MED ORDER — HEMOSTATIC AGENTS (NO CHARGE) OPTIME
TOPICAL | Status: DC | PRN
  Administered 2017-06-17: 1 via TOPICAL

## 2017-06-17 MED ORDER — ONDANSETRON HCL 4 MG/2ML IJ SOLN
INTRAMUSCULAR | Status: AC
  Filled 2017-06-17: qty 2

## 2017-06-17 MED ORDER — CEFAZOLIN SODIUM-DEXTROSE 2-4 GM/100ML-% IV SOLN
2.0000 g | INTRAVENOUS | Status: AC
  Administered 2017-06-17: 2 g via INTRAVENOUS
  Filled 2017-06-17: qty 100

## 2017-06-17 MED ORDER — PROPOFOL 10 MG/ML IV BOLUS
INTRAVENOUS | Status: AC
  Filled 2017-06-17: qty 20

## 2017-06-17 MED ORDER — BUPIVACAINE LIPOSOME 1.3 % IJ SUSP
20.0000 mL | Freq: Once | INTRAMUSCULAR | Status: DC
  Filled 2017-06-17: qty 20

## 2017-06-17 MED ORDER — IPRATROPIUM-ALBUTEROL 0.5-2.5 (3) MG/3ML IN SOLN
RESPIRATORY_TRACT | Status: AC
  Filled 2017-06-17: qty 3

## 2017-06-17 MED ORDER — MIDAZOLAM HCL 2 MG/2ML IJ SOLN
1.0000 mg | INTRAMUSCULAR | Status: DC
  Administered 2017-06-17: 2 mg via INTRAVENOUS
  Filled 2017-06-17: qty 2

## 2017-06-17 MED ORDER — NEOSTIGMINE METHYLSULFATE 10 MG/10ML IV SOLN
INTRAVENOUS | Status: DC | PRN
  Administered 2017-06-17: 4 mg via INTRAVENOUS

## 2017-06-17 MED ORDER — SODIUM CHLORIDE 0.9 % IJ SOLN
INTRAMUSCULAR | Status: AC
  Filled 2017-06-17: qty 10

## 2017-06-17 MED ORDER — PROPOFOL 10 MG/ML IV BOLUS
INTRAVENOUS | Status: DC | PRN
  Administered 2017-06-17: 20 mg via INTRAVENOUS
  Administered 2017-06-17: 150 mg via INTRAVENOUS
  Administered 2017-06-17: 30 mg via INTRAVENOUS

## 2017-06-17 MED ORDER — MIDAZOLAM HCL 5 MG/5ML IJ SOLN
INTRAMUSCULAR | Status: DC | PRN
  Administered 2017-06-17: 2 mg via INTRAVENOUS

## 2017-06-17 MED ORDER — IPRATROPIUM-ALBUTEROL 0.5-2.5 (3) MG/3ML IN SOLN
3.0000 mL | Freq: Once | RESPIRATORY_TRACT | Status: AC
  Administered 2017-06-17: 3 mL via RESPIRATORY_TRACT

## 2017-06-17 MED ORDER — GABAPENTIN 300 MG PO CAPS
900.0000 mg | ORAL_CAPSULE | Freq: Four times a day (QID) | ORAL | Status: DC
  Administered 2017-06-17 – 2017-06-18 (×3): 900 mg via ORAL
  Filled 2017-06-17 (×3): qty 3

## 2017-06-17 MED ORDER — DOCUSATE SODIUM 100 MG PO CAPS
100.0000 mg | ORAL_CAPSULE | Freq: Two times a day (BID) | ORAL | Status: DC
  Administered 2017-06-17 – 2017-06-18 (×2): 100 mg via ORAL
  Filled 2017-06-17 (×2): qty 1

## 2017-06-17 MED ORDER — FENTANYL CITRATE (PF) 100 MCG/2ML IJ SOLN
INTRAMUSCULAR | Status: AC
  Filled 2017-06-17: qty 2

## 2017-06-17 MED ORDER — KETOROLAC TROMETHAMINE 30 MG/ML IJ SOLN
30.0000 mg | Freq: Once | INTRAMUSCULAR | Status: AC
  Administered 2017-06-17: 30 mg via INTRAVENOUS

## 2017-06-17 MED ORDER — FENTANYL CITRATE (PF) 100 MCG/2ML IJ SOLN
INTRAMUSCULAR | Status: DC | PRN
  Administered 2017-06-17 (×3): 50 ug via INTRAVENOUS
  Administered 2017-06-17: 25 ug via INTRAVENOUS
  Administered 2017-06-17: 50 ug via INTRAVENOUS
  Administered 2017-06-17: 25 ug via INTRAVENOUS
  Administered 2017-06-17: 100 ug via INTRAVENOUS
  Administered 2017-06-17 (×2): 50 ug via INTRAVENOUS

## 2017-06-17 MED ORDER — FENTANYL CITRATE (PF) 100 MCG/2ML IJ SOLN
INTRAMUSCULAR | Status: AC
Start: 2017-06-17 — End: ?
  Filled 2017-06-17: qty 2

## 2017-06-17 MED ORDER — KETOROLAC TROMETHAMINE 30 MG/ML IJ SOLN
30.0000 mg | Freq: Three times a day (TID) | INTRAMUSCULAR | Status: AC
  Administered 2017-06-17 – 2017-06-18 (×2): 30 mg via INTRAVENOUS
  Filled 2017-06-17 (×2): qty 1

## 2017-06-17 MED ORDER — PANTOPRAZOLE SODIUM 40 MG PO TBEC
40.0000 mg | DELAYED_RELEASE_TABLET | Freq: Every day | ORAL | Status: DC
  Administered 2017-06-18: 40 mg via ORAL
  Filled 2017-06-17: qty 1

## 2017-06-17 MED ORDER — ZOLPIDEM TARTRATE 5 MG PO TABS: 5.0000 mg | ORAL_TABLET | Freq: Every evening | ORAL | Status: DC | PRN

## 2017-06-17 MED ORDER — PROMETHAZINE HCL 25 MG/ML IJ SOLN: 25.0000 mg | Freq: Four times a day (QID) | INTRAMUSCULAR | Status: DC | PRN

## 2017-06-17 MED ORDER — SENNOSIDES-DOCUSATE SODIUM 8.6-50 MG PO TABS: 1.0000 | ORAL_TABLET | Freq: Every evening | ORAL | Status: DC | PRN

## 2017-06-17 MED ORDER — SODIUM CHLORIDE 0.9 % IV SOLN
8.0000 mg | Freq: Four times a day (QID) | INTRAVENOUS | Status: DC | PRN
  Filled 2017-06-17: qty 4

## 2017-06-17 SURGICAL SUPPLY — 62 items
APPLIER CLIP 5 13 M/L LIGAMAX5 (MISCELLANEOUS) ×3
BAG HAMPER (MISCELLANEOUS) ×3 IMPLANT
BAG RETRIEVAL 10 (BASKET) ×1
BAG RETRIEVAL 10MM (BASKET) ×1
BLADE SURG SZ11 CARB STEEL (BLADE) ×6 IMPLANT
CLIP APPLIE 5 13 M/L LIGAMAX5 (MISCELLANEOUS) ×1 IMPLANT
CLOTH BEACON ORANGE TIMEOUT ST (SAFETY) ×6 IMPLANT
COVER LIGHT HANDLE STERIS (MISCELLANEOUS) ×12 IMPLANT
DERMABOND ADVANCED (GAUZE/BANDAGES/DRESSINGS) ×2
DERMABOND ADVANCED .7 DNX12 (GAUZE/BANDAGES/DRESSINGS) ×1 IMPLANT
DRSG OPSITE POSTOP 4X10 (GAUZE/BANDAGES/DRESSINGS) ×6 IMPLANT
ELECT REM PT RETURN 9FT ADLT (ELECTROSURGICAL) ×3
ELECTRODE REM PT RTRN 9FT ADLT (ELECTROSURGICAL) ×1 IMPLANT
FILTER SMOKE EVAC LAPAROSHD (FILTER) ×3 IMPLANT
FORMALIN 10 PREFIL 480ML (MISCELLANEOUS) ×3 IMPLANT
GAUZE SPONGE 4X4 12PLY STRL (GAUZE/BANDAGES/DRESSINGS) ×3 IMPLANT
GLOVE BIOGEL PI IND STRL 6.5 (GLOVE) ×4 IMPLANT
GLOVE BIOGEL PI IND STRL 7.0 (GLOVE) ×3 IMPLANT
GLOVE BIOGEL PI IND STRL 8 (GLOVE) ×2 IMPLANT
GLOVE BIOGEL PI INDICATOR 6.5 (GLOVE) ×8
GLOVE BIOGEL PI INDICATOR 7.0 (GLOVE) ×6
GLOVE BIOGEL PI INDICATOR 8 (GLOVE) ×4
GLOVE ECLIPSE 8.0 STRL XLNG CF (GLOVE) ×6 IMPLANT
GOWN STRL REUS W/TWL LRG LVL3 (GOWN DISPOSABLE) ×6 IMPLANT
GOWN STRL REUS W/TWL XL LVL3 (GOWN DISPOSABLE) ×6 IMPLANT
HEMOSTAT ARISTA ABSORB 3G PWDR (MISCELLANEOUS) ×3 IMPLANT
INST SET LAPROSCOPIC GYN AP (KITS) ×3 IMPLANT
IV NS IRRIG 3000ML ARTHROMATIC (IV SOLUTION) ×3 IMPLANT
KIT ROOM TURNOVER APOR (KITS) ×3 IMPLANT
MANIFOLD NEPTUNE II (INSTRUMENTS) ×3 IMPLANT
NEEDLE HYPO 18GX1.5 BLUNT FILL (NEEDLE) ×3 IMPLANT
NEEDLE HYPO 25X1 1.5 SAFETY (NEEDLE) ×3 IMPLANT
NEEDLE INSUFFLATION 120MM (ENDOMECHANICALS) ×3 IMPLANT
NS IRRIG 1000ML POUR BTL (IV SOLUTION) ×3 IMPLANT
PACK ABDOMINAL MAJOR (CUSTOM PROCEDURE TRAY) ×3 IMPLANT
PACK PERI GYN (CUSTOM PROCEDURE TRAY) ×3 IMPLANT
PAD ARMBOARD 7.5X6 YLW CONV (MISCELLANEOUS) ×6 IMPLANT
RETRACTOR WND ALEXIS 25 LRG (MISCELLANEOUS) ×1 IMPLANT
RTRCTR WOUND ALEXIS 25CM LRG (MISCELLANEOUS) ×3
SET BASIN LINEN APH (SET/KITS/TRAYS/PACK) ×6 IMPLANT
SET TUBE IRRIG SUCTION NO TIP (IRRIGATION / IRRIGATOR) ×3 IMPLANT
SHEARS HARMONIC ACE PLUS 36CM (ENDOMECHANICALS) ×3 IMPLANT
SOLUTION ANTI FOG 6CC (MISCELLANEOUS) ×3 IMPLANT
SPONGE GAUZE 2X2 8PLY STER LF (GAUZE/BANDAGES/DRESSINGS) ×1
SPONGE GAUZE 2X2 8PLY STRL LF (GAUZE/BANDAGES/DRESSINGS) ×2 IMPLANT
SPONGE LAP 18X18 X RAY DECT (DISPOSABLE) ×9 IMPLANT
STAPLER VISISTAT 35W (STAPLE) ×3 IMPLANT
SUT VIC AB 0 CT1 27 (SUTURE) ×4
SUT VIC AB 0 CT1 27XCR 8 STRN (SUTURE) ×2 IMPLANT
SUT VICRYL 0 UR6 27IN ABS (SUTURE) ×3 IMPLANT
SUT VICRYL 3 0 (SUTURE) ×3 IMPLANT
SYR 20CC LL (SYRINGE) ×3 IMPLANT
SYRINGE 10CC LL (SYRINGE) ×3 IMPLANT
SYS BAG RETRIEVAL 10MM (BASKET) ×1
SYSTEM BAG RETRIEVAL 10MM (BASKET) ×1 IMPLANT
TAPE PAPER 1X10 WHT MICROPORE (GAUZE/BANDAGES/DRESSINGS) ×3 IMPLANT
TRAY FOLEY CATH SILVER 16FR (SET/KITS/TRAYS/PACK) ×3 IMPLANT
TROCAR ENDO BLADELESS 11MM (ENDOMECHANICALS) ×3 IMPLANT
TROCAR SLEEVE XCEL 5X75 (ENDOMECHANICALS) ×3 IMPLANT
TROCAR XCEL NON-BLD 5MMX100MML (ENDOMECHANICALS) ×3 IMPLANT
TUBING INSUF HEATED (TUBING) ×3 IMPLANT
WARMER LAPAROSCOPE (MISCELLANEOUS) ×3 IMPLANT

## 2017-06-17 NOTE — Transfer of Care (Signed)
Immediate Anesthesia Transfer of Care Note  Patient: Emily Vasquez  Procedure(s) Performed: Procedure(s) with comments: diagnostic laproscopy (Left) - decision to open at [place]; LEFT SALPINGO OOPHORECTOMY (Left)  Patient Location: PACU  Anesthesia Type:General  Level of Consciousness: drowsy and patient cooperative  Airway & Oxygen Therapy: Patient Spontanous Breathing and Patient connected to face mask oxygen  Post-op Assessment: Report given to RN and Post -op Vital signs reviewed and stable  Post vital signs: Reviewed and stable  Last Vitals:  Vitals:   06/17/17 1225 06/17/17 1230  BP: 114/79 111/76  Resp: (!) 36 (!) 46  Temp:    SpO2: 97% 97%    Last Pain:  Vitals:   06/17/17 1010  PainSc: 4       Patients Stated Pain Goal: 8 (86/16/83 7290)  Complications: No apparent anesthesia complications

## 2017-06-17 NOTE — Op Note (Signed)
preoperative diagnoses:  Complex mass of the left ovary, increasingly painful, 9 cm  Postoperative diagnosis:  Same as above with the mass appearing to be a tubo-ovarian abscess with dense adhesions to the pelvic floor, pelvic sidewalls bilaterally,  posterior cul-de-sac and colon  Procedure:  Diagnostic laparoscopy(had planned to do an operative laparoscopy with left salpingo-oophorectomy but was unable to do so)  Total abdominal hysterectomy with left salpingo-oophorectomy, extensive lysis of adhesions  Surgeon:  Florian Buff, MD   Anesthesia:  Gen. Endotracheal  Findings:  Preoperatively the patient had a 9 cm ovarian mass posterior to the uterus which was complex with multiple septations.  Her CA-125 was normal so we decided to proceed with a laparoscopic removal of the mass.  However after getting the laparoscope in place the mass was densely adherent really filling up the entire pelvis, and could not be date dissected safely away from the colon.  My impression that this probably represented a tubo-ovarian abscess judging by the indurated tissue in the way the adhesions of the colon were the mass.  I decided this point that I cannot safely or even really remove the mass at all laparoscopically and immediately converted to a laparotomy.  At this point I I proceeded to talk to the family.  However her father and boyfriend were not in the facility so we called her father and his cell phone.  I informed him of my intraoperative findings and felt that the most appropriate procedure for the patient was an abdominal hysterectomy and removal of the left tube and ovary with indicated procedures.  I was concerned because of the mass being posterior that she would continue to have dyspareunia and pain if I left the uterus and the cervix.  So at this point I decided to proceed with abdominal hysterectomy complete including the cervix is well.  I basically informed her father that I was doing what I thought  was in the patient's best interest and what I would want someone to do for one up by family members.  He verbally consented over the phone for this plan.  Description of operation: Patient was taken to the operating room where she underwent general endotracheal anesthesia She was placed in low lithotomy position She was prepped and draped in the usual sterile fashion and a Foley catheter was placed Timeout was performed and everyone in the group room was in agreement with the procedure  Incision was made in the umbilicus and the fascia was grasped with 2 Coker clamps Veres needle was used and placed into the peritoneal cavity with one pass without difficulty The peritoneal cavity was insufflated The video laparoscope was then placed into the peritoneal cavity using a non-bladed 11 mm trocar with one pass that difficulty I then made incisions in the right and left lower quadrant and 5 mm trochars were placed in each lower quadrant under direct laparoscopic visualization again without difficulty I spent some time understanding I will was going on with the left adnexa and found to be densely adherent to the: Pelvic sidewalls and pelvic floor I was unable to safely dissected off of the colon nor was I able to liberate it from the posterior cul-de-sac As a result at this point I made the decision to proceed with a laparotomy and when out to talk with the family as I described above under findings I then closed the umbilical fascia with a single Vicryl suture In all 3 incisions were closed with skin staples  I told  OR staff at this point to just break everything down and not completely reprepped and repositioned the patient instruments drapes everything new This took about 15 minutes or so  The patient was one again once again prepped and draped in usual fashion and she was in the supine position  When we restarted the second portion of the case I made a Pfannenstiel skin incision which was  carried  down sharply to the rectus fascia which was scored in the midline, extended laterally and taken off the muscles superiorly and in further that difficulty The peritoneal cavity was entered without difficulty A large Alexis retractor was placed The upper abdomen was packed away for visualization The mass was in the posterior cul-de-sacs Y decided to remove the uterus first for better visualization and anatomical evaluation The round ligaments were clamped cut and suture ligated bilaterally The bladder was adherent from the pre-patient's previous C-sections to the anterior uterine wall Form to develop a plane between the bladder and the uterus The uterine vessels were then clamped cut and transfixion suture ligated bilaterally Serial Pedicles were taken down the cervix she's pedicle being clamped cut and suture ligated The vagina was then crossclamped and the uterus and cervix were removed The vagina was closed with vaginal angle sutures in interrupted figure-of-eight sutures There was good hemostasis Attention was then turned to the left adnexal mass Mostly blunt dissection was performed to liberate the mass from the posterior cul-de-sac which was densely adherent to as well as the colon Some sharp dissection was used free the mass from the sigmoid and left pelvic sidewall Based on the induration of the tissue I'm sure that this was a tubo-ovarian abscess although it certainly wasn't active at this point and appeared to be "" burned-out The ureter was identified on the left Retroperitoneal space was opened The infundibulopelvic ligament and the ovarian vasculature was identified crossclamped and double suture ligated with good hemostasis The peritoneal surfaces that dissected the mass off of were slightly oozy so Arista was used to ensure postoperative hemostasis All the packs were removed at this point the Alexis retractor was removed and all counts were correct The muscles and  peritoneum were reapproximated loosely The fascia was closed using 0 Vicryl running The subcutaneous tissue was made hemostatic and irrigated exparel was injected in subcutaneous space The skin was closed using 3-0 Vicryl on a Keith needle in a subcuticular fashion The patient tolerated the procedure well she experienced 200 cc blood loss for the entire procedure She received Ancef and Toradol prophylactically preoperatively aLL counts were correct 3 at the end of the case  Florian Buff, MD 06/17/2017 3:38 PM

## 2017-06-17 NOTE — Anesthesia Preprocedure Evaluation (Signed)
Anesthesia Evaluation  Patient identified by MRN, date of birth, ID band Patient awake    Reviewed: Allergy & Precautions, NPO status , Patient's Chart, lab work & pertinent test results  Airway Mallampati: II  TM Distance: >3 FB     Dental  (+) Teeth Intact   Pulmonary asthma , Current Smoker,    breath sounds clear to auscultation       Cardiovascular negative cardio ROS   Rhythm:Regular Rate:Normal     Neuro/Psych PSYCHIATRIC DISORDERS (ADD) Anxiety    GI/Hepatic GERD  Medicated and Controlled,  Endo/Other    Renal/GU      Musculoskeletal  (+) Fibromyalgia -  Abdominal   Peds  Hematology   Anesthesia Other Findings   Reproductive/Obstetrics                             Anesthesia Physical Anesthesia Plan  ASA: II  Anesthesia Plan: General   Post-op Pain Management:    Induction: Intravenous  PONV Risk Score and Plan:   Airway Management Planned: Oral ETT  Additional Equipment:   Intra-op Plan:   Post-operative Plan: Extubation in OR  Informed Consent: I have reviewed the patients History and Physical, chart, labs and discussed the procedure including the risks, benefits and alternatives for the proposed anesthesia with the patient or authorized representative who has indicated his/her understanding and acceptance.     Plan Discussed with:   Anesthesia Plan Comments:         Anesthesia Quick Evaluation

## 2017-06-17 NOTE — Anesthesia Procedure Notes (Signed)
Procedure Name: Intubation Date/Time: 06/17/2017 12:46 PM Performed by: Vista Deck Pre-anesthesia Checklist: Patient identified, Patient being monitored, Timeout performed, Emergency Drugs available and Suction available Patient Re-evaluated:Patient Re-evaluated prior to induction Oxygen Delivery Method: Circle System Utilized Preoxygenation: Pre-oxygenation with 100% oxygen Induction Type: IV induction Ventilation: Mask ventilation without difficulty Laryngoscope Size: Mac and 3 Grade View: Grade I Tube type: Oral Tube size: 7.0 mm Number of attempts: 1 Airway Equipment and Method: stylet Placement Confirmation: ETT inserted through vocal cords under direct vision,  positive ETCO2 and breath sounds checked- equal and bilateral Secured at: 21 cm Tube secured with: Tape Dental Injury: Teeth and Oropharynx as per pre-operative assessment

## 2017-06-17 NOTE — H&P (Signed)
Preoperative History and Physical  Emily Vasquez is a 36 y.o. [id number] with No LMP recorded. admitted for a laparoscopic left salpingo oophorectomy for persistent increasingly painful left ovarian complex mass.  CA 125 is normal, pt elects for removal, has a history of right ovarian torsion last with subsequent removal  PMH:    Past Medical History:  Diagnosis Date  . ADD (attention deficit disorder)   . Anxiety   . Asthma   . Degenerative disc disease, cervical   . Fibromyalgia   . GERD (gastroesophageal reflux disease)   . Low back pain   . Ovarian cyst   . Ovarian torsion     PSH:     Past Surgical History:  Procedure Laterality Date  . CESAREAN SECTION    . HAND SURGERY    . OOPHORECTOMY    . TUBAL LIGATION      POb/GynH:      OB History    Gravida Para Term Preterm AB Living   2 2 2     2    SAB TAB Ectopic Multiple Live Births           2      SH:   Social History  Substance Use Topics  . Smoking status: Current Every Day Smoker    Packs/day: 0.50    Years: 26.00    Types: Cigarettes  . Smokeless tobacco: Never Used  . Alcohol use Yes     Comment: occasional    FH:    Family History  Problem Relation Age of Onset  . Alcohol abuse Paternal Grandfather   . Mental illness Maternal Grandmother   . Diabetes Maternal Grandmother   . Heart attack Maternal Grandfather   . Heart attack Father   . High Cholesterol Father   . Diabetes Brother   . Stroke Brother   . Asthma Daughter   . Asthma Son      Allergies:  Allergies  Allergen Reactions  . Compazine [Prochlorperazine Maleate] Other (See Comments)    Nausea and muscle spasms    Medications:       Current Facility-Administered Medications:  .  bupivacaine liposome (EXPAREL) 1.3 % injection 266 mg, 20 mL, Infiltration, Once, Oree Hislop H, MD .  ceFAZolin (ANCEF) IVPB 2g/100 mL premix, 2 g, Intravenous, On Call to OR, Florian Buff, MD .  lactated ringers infusion, , Intravenous,  Continuous, Lerry Liner, MD  Review of Systems:   Review of Systems  Constitutional: Negative for fever, chills, weight loss, malaise/fatigue and diaphoresis.  HENT: Negative for hearing loss, ear pain, nosebleeds, congestion, sore throat, neck pain, tinnitus and ear discharge.   Eyes: Negative for blurred vision, double vision, photophobia, pain, discharge and redness.  Respiratory: Negative for cough, hemoptysis, sputum production, shortness of breath, wheezing and stridor.   Cardiovascular: Negative for chest pain, palpitations, orthopnea, claudication, leg swelling and PND.  Gastrointestinal: Positive for abdominal pain. Negative for heartburn, nausea, vomiting, diarrhea, constipation, blood in stool and melena.  Genitourinary: Negative for dysuria, urgency, frequency, hematuria and flank pain.  Musculoskeletal: Negative for myalgias, back pain, joint pain and falls.  Skin: Negative for itching and rash.  Neurological: Negative for dizziness, tingling, tremors, sensory change, speech change, focal weakness, seizures, loss of consciousness, weakness and headaches.  Endo/Heme/Allergies: Negative for environmental allergies and polydipsia. Does not bruise/bleed easily.  Psychiatric/Behavioral: Negative for depression, suicidal ideas, hallucinations, memory loss and substance abuse. The patient is not nervous/anxious and does not have insomnia.  PHYSICAL EXAM:  Blood pressure (!) 104/51, temperature 98.2 F (36.8 C), resp. rate (!) 77, SpO2 92 %.    Vitals reviewed. Constitutional: She is oriented to person, place, and time. She appears well-developed and well-nourished.  HENT:  Head: Normocephalic and atraumatic.  Right Ear: External ear normal.  Left Ear: External ear normal.  Nose: Nose normal.  Mouth/Throat: Oropharynx is clear and moist.  Eyes: Conjunctivae and EOM are normal. Pupils are equal, round, and reactive to light. Right eye exhibits no discharge. Left eye  exhibits no discharge. No scleral icterus.  Neck: Normal range of motion. Neck supple. No tracheal deviation present. No thyromegaly present.  Cardiovascular: Normal rate, regular rhythm, normal heart sounds and intact distal pulses.  Exam reveals no gallop and no friction rub.   No murmur heard. Respiratory: Effort normal and breath sounds normal. No respiratory distress. She has no wheezes. She has no rales. She exhibits no tenderness.  GI: Soft. Bowel sounds are normal. She exhibits no distension and no mass. There is tenderness. There is no rebound and no guarding.  Genitourinary:       Vulva is normal without lesions Vagina is pink moist without discharge Cervix normal in appearance and pap is normal Uterus is normal size, contour, position, consistency, mobility, non-tender Adnexa is per sonogram Musculoskeletal: Normal range of motion. She exhibits no edema and no tenderness.  Neurological: She is alert and oriented to person, place, and time. She has normal reflexes. She displays normal reflexes. No cranial nerve deficit. She exhibits normal muscle tone. Coordination normal.  Skin: Skin is warm and dry. No rash noted. No erythema. No pallor.  Psychiatric: She has a normal mood and affect. Her behavior is normal. Judgment and thought content normal.    Labs: Results for orders placed or performed during the hospital encounter of 06/12/17 (from the past 336 hour(s))  CBC   Collection Time: 06/12/17  2:09 PM  Result Value Ref Range   WBC 8.5 4.0 - 10.5 K/uL   RBC 4.68 3.87 - 5.11 MIL/uL   Hemoglobin 14.1 12.0 - 15.0 g/dL   HCT 41.8 36.0 - 46.0 %   MCV 89.3 78.0 - 100.0 fL   MCH 30.1 26.0 - 34.0 pg   MCHC 33.7 30.0 - 36.0 g/dL   RDW 14.1 11.5 - 15.5 %   Platelets 395 150 - 400 K/uL  Comprehensive metabolic panel   Collection Time: 06/12/17  2:09 PM  Result Value Ref Range   Sodium 140 135 - 145 mmol/L   Potassium 4.1 3.5 - 5.1 mmol/L   Chloride 109 101 - 111 mmol/L   CO2 21  (L) 22 - 32 mmol/L   Glucose, Bld 94 65 - 99 mg/dL   BUN 9 6 - 20 mg/dL   Creatinine, Ser 0.87 0.44 - 1.00 mg/dL   Calcium 9.4 8.9 - 10.3 mg/dL   Total Protein 7.0 6.5 - 8.1 g/dL   Albumin 4.1 3.5 - 5.0 g/dL   AST 33 15 - 41 U/L   ALT 39 14 - 54 U/L   Alkaline Phosphatase 63 38 - 126 U/L   Total Bilirubin 0.3 0.3 - 1.2 mg/dL   GFR calc non Af Amer >60 >60 mL/min   GFR calc Af Amer >60 >60 mL/min   Anion gap 10 5 - 15  hCG, quantitative, pregnancy   Collection Time: 06/12/17  2:09 PM  Result Value Ref Range   hCG, Beta Chain, Quant, S 1 <5 mIU/mL  Rapid  HIV screen (HIV 1/2 Ab+Ag)   Collection Time: 06/12/17  2:09 PM  Result Value Ref Range   HIV-1 P24 Antigen - HIV24 NON REACTIVE NON REACTIVE   HIV 1/2 Antibodies NON REACTIVE NON REACTIVE   Interpretation (HIV Ag Ab)      A non reactive test result means that HIV 1 or HIV 2 antibodies and HIV 1 p24 antigen were not detected in the specimen.  Urinalysis, Routine w reflex microscopic   Collection Time: 06/12/17  2:09 PM  Result Value Ref Range   Color, Urine YELLOW YELLOW   APPearance HAZY (A) CLEAR   Specific Gravity, Urine 1.017 1.005 - 1.030   pH 5.0 5.0 - 8.0   Glucose, UA NEGATIVE NEGATIVE mg/dL   Hgb urine dipstick NEGATIVE NEGATIVE   Bilirubin Urine NEGATIVE NEGATIVE   Ketones, ur NEGATIVE NEGATIVE mg/dL   Protein, ur NEGATIVE NEGATIVE mg/dL   Nitrite NEGATIVE NEGATIVE   Leukocytes, UA SMALL (A) NEGATIVE   RBC / HPF 0-5 0 - 5 RBC/hpf   WBC, UA 6-30 0 - 5 WBC/hpf   Bacteria, UA RARE (A) NONE SEEN   Squamous Epithelial / LPF 0-5 (A) NONE SEEN  Type and screen   Collection Time: 06/12/17  2:09 PM  Result Value Ref Range   ABO/RH(D) O POS    Antibody Screen NEG    Sample Expiration 06/26/2017    Extend sample reason NO TRANSFUSIONS OR PREGNANCY IN THE PAST 3 MONTHS     EKG: No orders found for this or any previous visit.  Imaging Studies: US Transvaginal Non-ob  Result Date: 05/24/2017 CLINICAL DATA:   36 year old female with history of left lower quadrant abdominal pain and nausea and vomiting since yesterday evening. EXAM: TRANSABDOMINAL AND TRANSVAGINAL ULTRASOUND OF PELVIS DOPPLER ULTRASOUND OF OVARIES TECHNIQUE: Both transabdominal and transvaginal ultrasound examinations of the pelvis were performed. Transabdominal technique was performed for global imaging of the pelvis including uterus, ovaries, adnexal regions, and pelvic cul-de-sac. It was necessary to proceed with endovaginal exam following the transabdominal exam to visualize the ovaries. Color and duplex Doppler ultrasound was utilized to evaluate blood flow to the ovaries. COMPARISON:  None. FINDINGS: Uterus Measurements: 9.1 x 7.3 x 7.1 cm. No fibroids or other mass visualized. Endometrium Thickness: 8 mm.  No focal abnormality visualized. Right ovary Status post right oophorectomy. Left ovary Measurements: 9.0 x 4.9 x 3.9 cm. Diffusely heterogeneous with multiple internal areas of heterogeneous echogenicity, likely to reflect the presence of several independent lesions, several of which are solid in appearance, but poorly evaluated on today's examination. One of these lesions measures 2.5 cm and demonstrates several thick internal septations. Pulsed Doppler evaluation of the left ovary demonstrate normal low-resistance arterial and venous waveforms. Other findings Trace volume of free fluid, likely physiologic. IMPRESSION: 1. Enlarged and heterogeneous appearing left ovary with multiple complex lesions, many of which appear partially solid or have thick internal septations. Findings are concerning for potential neoplasm and could be better evaluated with followup nonemergent MRI of the pelvis with and without IV gadolinium. 2. At this time, there is no evidence for left ovarian torsion. 3. Status post right oophorectomy. Electronically Signed   By: Vinnie Langton M.D.   On: 05/24/2017 15:52   US Pelvis Complete  Result Date:  05/24/2017 CLINICAL DATA:  36 year old female with history of left lower quadrant abdominal pain and nausea and vomiting since yesterday evening. EXAM: TRANSABDOMINAL AND TRANSVAGINAL ULTRASOUND OF PELVIS DOPPLER ULTRASOUND OF OVARIES TECHNIQUE: Both transabdominal and transvaginal  ultrasound examinations of the pelvis were performed. Transabdominal technique was performed for global imaging of the pelvis including uterus, ovaries, adnexal regions, and pelvic cul-de-sac. It was necessary to proceed with endovaginal exam following the transabdominal exam to visualize the ovaries. Color and duplex Doppler ultrasound was utilized to evaluate blood flow to the ovaries. COMPARISON:  None. FINDINGS: Uterus Measurements: 9.1 x 7.3 x 7.1 cm. No fibroids or other mass visualized. Endometrium Thickness: 8 mm.  No focal abnormality visualized. Right ovary Status post right oophorectomy. Left ovary Measurements: 9.0 x 4.9 x 3.9 cm. Diffusely heterogeneous with multiple internal areas of heterogeneous echogenicity, likely to reflect the presence of several independent lesions, several of which are solid in appearance, but poorly evaluated on today's examination. One of these lesions measures 2.5 cm and demonstrates several thick internal septations. Pulsed Doppler evaluation of the left ovary demonstrate normal low-resistance arterial and venous waveforms. Other findings Trace volume of free fluid, likely physiologic. IMPRESSION: 1. Enlarged and heterogeneous appearing left ovary with multiple complex lesions, many of which appear partially solid or have thick internal septations. Findings are concerning for potential neoplasm and could be better evaluated with followup nonemergent MRI of the pelvis with and without IV gadolinium. 2. At this time, there is no evidence for left ovarian torsion. 3. Status post right oophorectomy. Electronically Signed   By: Vinnie Langton M.D.   On: 05/24/2017 15:52   [place] Art/ven Flow Abd Pelv  Doppler  Result Date: 05/24/2017 CLINICAL DATA:  36 year old female with history of left lower quadrant abdominal pain and nausea and vomiting since yesterday evening. EXAM: TRANSABDOMINAL AND TRANSVAGINAL ULTRASOUND OF PELVIS DOPPLER ULTRASOUND OF OVARIES TECHNIQUE: Both transabdominal and transvaginal ultrasound examinations of the pelvis were performed. Transabdominal technique was performed for global imaging of the pelvis including uterus, ovaries, adnexal regions, and pelvic cul-de-sac. It was necessary to proceed with endovaginal exam following the transabdominal exam to visualize the ovaries. Color and duplex Doppler ultrasound was utilized to evaluate blood flow to the ovaries. COMPARISON:  None. FINDINGS: Uterus Measurements: 9.1 x 7.3 x 7.1 cm. No fibroids or other mass visualized. Endometrium Thickness: 8 mm.  No focal abnormality visualized. Right ovary Status post right oophorectomy. Left ovary Measurements: 9.0 x 4.9 x 3.9 cm. Diffusely heterogeneous with multiple internal areas of heterogeneous echogenicity, likely to reflect the presence of several independent lesions, several of which are solid in appearance, but poorly evaluated on today's examination. One of these lesions measures 2.5 cm and demonstrates several thick internal septations. Pulsed Doppler evaluation of the left ovary demonstrate normal low-resistance arterial and venous waveforms. Other findings Trace volume of free fluid, likely physiologic. IMPRESSION: 1. Enlarged and heterogeneous appearing left ovary with multiple complex lesions, many of which appear partially solid or have thick internal septations. Findings are concerning for potential neoplasm and could be better evaluated with followup nonemergent MRI of the pelvis with and without IV gadolinium. 2. At this time, there is no evidence for left ovarian torsion. 3. Status post right oophorectomy. Electronically Signed   By: Vinnie Langton M.D.   On: 05/24/2017 15:52    Ct Renal Stone Study  Result Date: 05/24/2017 CLINICAL DATA:  36 year old female with history of left lower abdominal pain radiating into the back. Vomiting since yesterday evening. EXAM: CT ABDOMEN AND PELVIS WITHOUT CONTRAST TECHNIQUE: Multidetector CT imaging of the abdomen and pelvis was performed following the standard protocol without IV contrast. COMPARISON:  No priors. FINDINGS: Lower chest: Unremarkable. Hepatobiliary: Severe diffuse low attenuation  throughout the hepatic parenchyma, indicative of severe hepatic steatosis. No discrete cystic or solid hepatic lesions are confidently identified on today's noncontrast CT examination. Unenhanced appearance of the gallbladder is normal. Pancreas: No definite pancreatic mass or peripancreatic inflammatory changes are noted on today's noncontrast CT examination. Spleen: Unremarkable. Adrenals/Urinary Tract: There are no abnormal calcifications within the collecting system of either kidney, along the course of either ureter, or within the lumen of the urinary bladder. No hydroureteronephrosis or perinephric stranding to suggest urinary tract obstruction at this time. The unenhanced appearance of the kidneys is unremarkable bilaterally. Unenhanced appearance of the urinary bladder is normal. Unenhanced appearance of the adrenal glands bilaterally is normal. Stomach/Bowel: Unenhanced appearance of the stomach is normal. There is no pathologic dilatation of small bowel or colon. Normal appendix. Vascular/Lymphatic: Minimal atherosclerotic calcifications are noted in the abdominal aorta and pelvic vasculature, without definite aneurysms. No lymphadenopathy noted in the abdomen or pelvis on today's noncontrast CT examination. Reproductive: Uterus and right ovary are unremarkable in appearance. Mass-like appearance of the left adnexa which measures approximately 8.6 x 5.5 x 6.6 cm (axial image 66 of series 3 and coronal image 70 of series 6), which appears to  contain several internal lesions which do not appear simple on today's noncontrast CT examination, largest of which measures approximately 3.0 x 3.5 x 4.4 cm (axial image 69 of series 3 and coronal image 76 of series 6). Other: Trace volume of free fluid in the cul-de-sac, presumably physiologic in this young female patient. No significant volume of ascites. No pneumoperitoneum. Musculoskeletal: There are no aggressive appearing lytic or blastic lesions noted in the visualized portions of the skeleton. IMPRESSION: 1. No acute findings are noted in the abdomen or pelvis to account for the patient's symptoms. 2. Mass-like enlargement of the left adnexa which appears to contain several ovarian lesions, some of which do not appear simple on today's noncontrast CT examination. Today's study is limited, and further evaluation with pelvic ultrasound is strongly recommended in the near future to better evaluate these findings and exclude underlying ovarian neoplasm or acute abnormality such as ovarian torsion. 3. Severe hepatic steatosis. Electronically Signed   By: Vinnie Langton M.D.   On: 05/24/2017 14:14      Assessment: Patient Active Problem List   Diagnosis Date Noted  . Complex ovarian cyst 05/28/2017  . Enlarged ovary 05/28/2017  . Pelvic pain 05/28/2017  Hx of right ovarian torsion, s/p removal last year Normal CA 125 Plan: Laparoscopic left salpingoophorectomy  Pt understands the risks of surgery including but not limited t  excessive bleeding requiring transfusion or reoperation, post-operative infection requiring prolonged hospitalization or re-hospitalization and antibiotic therapy, and damage to other organs including bladder, bowel, ureters and major vessels.  The patient also understands the alternative treatment options which were discussed in full.  All questions were answered.  Clarine Elrod H 06/17/2017 12:23 PM   Aerika Groll H 06/17/2017 12:22 PM

## 2017-06-17 NOTE — Anesthesia Postprocedure Evaluation (Signed)
Anesthesia Post Note  Patient: Emily Vasquez  Procedure(s) Performed: Procedure(s) (LRB): diagnostic laproscopy (Left) HYSTERECTOMY ABDOMINAL; LEFT SALPINGO OOPHORECTOMY (Left)  Patient location during evaluation: PACU Anesthesia Type: General Level of consciousness: awake and alert, oriented and patient cooperative Pain management: pain level controlled Vital Signs Assessment: post-procedure vital signs reviewed and stable Respiratory status: spontaneous breathing and patient connected to face mask oxygen Cardiovascular status: stable Postop Assessment: no signs of nausea or vomiting Anesthetic complications: no     Last Vitals:  Vitals:   06/17/17 1230 06/17/17 1532  BP: 111/76 (!) (P) 149/100  Resp: (!) 46 (P) 20  Temp:  (!) (P) 36.4 C  SpO2: 97% (P) 100%    Last Pain:  Vitals:   06/17/17 1545  PainSc: (P) 7                  Ajani Rineer A

## 2017-06-18 ENCOUNTER — Ambulatory Visit: Payer: Self-pay | Admitting: Family Medicine

## 2017-06-18 ENCOUNTER — Encounter (HOSPITAL_COMMUNITY): Payer: Self-pay | Admitting: Obstetrics & Gynecology

## 2017-06-18 LAB — BASIC METABOLIC PANEL
Anion gap: 6 (ref 5–15)
BUN: 10 mg/dL (ref 6–20)
CHLORIDE: 101 mmol/L (ref 101–111)
CO2: 26 mmol/L (ref 22–32)
Calcium: 8.2 mg/dL — ABNORMAL LOW (ref 8.9–10.3)
Creatinine, Ser: 0.77 mg/dL (ref 0.44–1.00)
GFR calc Af Amer: >60 mL/min (ref >60)
GFR calc non Af Amer: >60 mL/min (ref >60)
GLUCOSE: 124 mg/dL — AB (ref 65–99)
POTASSIUM: 3.7 mmol/L (ref 3.5–5.1)
Sodium: 133 mmol/L — ABNORMAL LOW (ref 135–145)

## 2017-06-18 LAB — CBC
HEMATOCRIT: 35.4 % — AB (ref 36.0–46.0)
Hemoglobin: 11.5 g/dL — ABNORMAL LOW (ref 12.0–15.0)
MCH: 29.8 pg (ref 26.0–34.0)
MCHC: 32.5 g/dL (ref 30.0–36.0)
MCV: 91.7 fL (ref 78.0–100.0)
Platelets: 236 10*3/uL (ref 150–400)
RBC: 3.86 MIL/uL — ABNORMAL LOW (ref 3.87–5.11)
RDW: 14.2 % (ref 11.5–15.5)
WBC: 6.7 10*3/uL (ref 4.0–10.5)

## 2017-06-18 MED ORDER — KETOROLAC TROMETHAMINE 10 MG PO TABS: 10.0000 mg | ORAL_TABLET | Freq: Three times a day (TID) | ORAL | 0 refills | Status: DC | PRN

## 2017-06-18 MED ORDER — ONDANSETRON HCL 8 MG PO TABS: 8.0000 mg | ORAL_TABLET | Freq: Four times a day (QID) | ORAL | 0 refills | Status: DC | PRN

## 2017-06-18 MED ORDER — OXYCODONE-ACETAMINOPHEN 7.5-325 MG PO TABS: 1.0000 | ORAL_TABLET | Freq: Four times a day (QID) | ORAL | 0 refills | Status: DC | PRN

## 2017-06-18 NOTE — Addendum Note (Signed)
Addendum  created 06/18/17 1040 by Charmaine Downs, CRNA   Sign clinical note

## 2017-06-18 NOTE — Anesthesia Postprocedure Evaluation (Signed)
Anesthesia Post Note  Patient: Emily Vasquez  Procedure(s) Performed: Procedure(s) (LRB): diagnostic laproscopy (Left) HYSTERECTOMY ABDOMINAL; LEFT SALPINGO OOPHORECTOMY (Left)  Patient location during evaluation: Nursing Unit Anesthesia Type: General Level of consciousness: awake and alert and patient cooperative Pain management: pain level controlled Vital Signs Assessment: post-procedure vital signs reviewed and stable Respiratory status: spontaneous breathing, nonlabored ventilation and respiratory function stable Cardiovascular status: blood pressure returned to baseline Postop Assessment: no signs of nausea or vomiting Anesthetic complications: no     Last Vitals:  Vitals:   06/18/17 0200 06/18/17 0600  BP: 123/87 120/85  Pulse: 67 71  Resp: 18 18  Temp: 36.4 C 36.6 C  SpO2: 97% 100%    Last Pain:  Vitals:   06/18/17 0600  TempSrc: Oral  PainSc:                  Simrat Kendrick J

## 2017-06-18 NOTE — Progress Notes (Signed)
Discharge instructions and prescriptions given, verbalized understanding, out in stable condition via w/c with staff. 

## 2017-06-18 NOTE — Discharge Instructions (Signed)
Abdominal Hysterectomy, Care After °This sheet gives you information about how to care for yourself after your procedure. Your health care provider may also give you more specific instructions. If you have problems or questions, contact your health care provider. °What can I expect after the procedure? °After your procedure, it is common to have: °· Pain. °· Fatigue. °· Poor appetite. °· Less interest in sex. °· Vaginal bleeding and discharge. You may need to use a sanitary napkin after this procedure. ° °Follow these instructions at home: °Bathing °· Do not take baths, swim, or use a hot tub until your health care provider approves. Ask your health care provider if you can take showers. You may only be allowed to take sponge baths for bathing. °· Keep the bandage (dressing) dry until your health care provider says it can be removed. °Incision care °· Follow instructions from your health care provider about how to take care of your incision. Make sure you: °? Wash your hands with soap and water before you change your bandage (dressing). If soap and water are not available, use hand sanitizer. °? Change your dressing as told by your health care provider. °? Leave stitches (sutures), skin glue, or adhesive strips in place. These skin closures may need to stay in place for 2 weeks or longer. If adhesive strip edges start to loosen and curl up, you may trim the loose edges. Do not remove adhesive strips completely unless your health care provider tells you to do that. °· Check your incision area every day for signs of infection. Check for: °? Redness, swelling, or pain. °? Fluid or blood. °? Warmth. °? Pus or a bad smell. °Activity °· Do gentle, daily exercises as told by your health care provider. You may be told to take short walks every day and go farther each time. °· Do not lift anything that is heavier than 10 lb (4.5 kg), or the limit that your health care provider tells you, until he or she says that it is  safe. °· Do not drive or use heavy machinery while taking prescription pain medicine. °· Do not drive for 24 hours if you were given a medicine to help you relax (sedative). °· Follow your health care provider's instructions about exercise, driving, and general activities. Ask your health care provider what activities are safe for you. °Lifestyle °· Do not douche, use tampons, or have sex for at least 6 weeks or as told by your health care provider. °· Do not drink alcohol until your health care provider approves. °· Drink enough fluid to keep your urine clear or pale yellow. °· Try to have someone at home with you for the first 1-2 weeks to help. °· Do not use any products that contain nicotine or tobacco, such as cigarettes and e-cigarettes. These can delay healing. If you need help quitting, ask your health care provider. °General instructions °· Take over-the-counter and prescription medicines only as told by your health care provider. °· Do not take aspirin or ibuprofen. These medicines can cause bleeding. °· To prevent or treat constipation while you are taking prescription pain medicine, your health care provider may recommend that you: °? Drink enough fluid to keep your urine clear or pale yellow. °? Take over-the-counter or prescription medicines. °? Eat foods that are high in fiber, such as fresh fruits and vegetables, whole grains, and beans. °? Limit foods that are high in fat and processed sugars, such as fried and sweet foods. °· Keep all   follow-up visits as told by your health care provider. This is important. °Contact a health care provider if: °· You have chills or fever. °· You have redness, swelling, or pain around your incision. °· You have fluid or blood coming from your incision. °· Your incision feels warm to the touch. °· You have pus or a bad smell coming from your incision. °· Your incision breaks open. °· You feel dizzy or light-headed. °· You have pain or bleeding when you urinate. °· You  have persistent diarrhea. °· You have persistent nausea and vomiting. °· You have abnormal vaginal discharge. °· You have a rash. °· You have any type of abnormal reaction or you develop an allergy to your medicine. °· Your pain medicine does not help. °Get help right away if: °· You have a fever and your symptoms suddenly get worse. °· You have severe abdominal pain. °· You have shortness of breath. °· You faint. °· You have pain, swelling, or redness in your leg. °· You have heavy vaginal bleeding with blood clots. °Summary °· After your procedure, it is common to have pain, fatigue and vaginal discharge. °· Do not take baths, swim, or use a hot tub until your health care provider approves. Ask your health care provider if you can take showers. You may only be allowed to take sponge baths for bathing. °· Follow your health care provider's instructions about exercise, driving, and general activities. Ask your health care provider what activities are safe for you. °· Do not lift anything that is heavier than 10 lb (4.5 kg), or the limit that your health care provider tells you, until he or she says that it is safe. °· Try to have someone at home with you for the first 1-2 weeks to help. °This information is not intended to replace advice given to you by your health care provider. Make sure you discuss any questions you have with your health care provider. °Document Released: 04/18/2005 Document Revised: 09/17/2016 Document Reviewed: 09/17/2016 °Elsevier Interactive Patient Education © 2017 [employer] ° °

## 2017-06-18 NOTE — Discharge Summary (Signed)
Physician Discharge Summary  Patient ID: Emily Vasquez MRN: [id number] DOB/AGE: [birth year] 36 y.o.  Admit date: 06/17/2017 Discharge date: 06/18/2017  Admission Diagnoses: Large pelvic mass  Discharge Diagnoses:  Active Problems:   S/P hysterectomy   Discharged Condition: good  Hospital Course: underwent laparoscopy but converted to laparotomy due to bowel adhesions, proceeded with abdominal hysterectomy and LSO  Consults: None  Significant Diagnostic Studies:   Treatments: surgery: TAH LSO  Discharge Exam: Blood pressure 120/85, pulse 71, temperature 97.8 F (36.6 C), temperature source Oral, resp. rate 18, height 5\' 10"  (1.778 m), weight 174 lb (78.9 kg), SpO2 95 %. General appearance: alert, cooperative and no distress GI: soft, non-tender; bowel sounds normal; no masses,  no organomegaly Incision/Wound:clean dry intact  Disposition: 01-Home or Self Care  Discharge Instructions    Call MD for:  persistant nausea and vomiting    Complete by:  As directed    Call MD for:  severe uncontrolled pain    Complete by:  As directed    Call MD for:  temperature >100.4    Complete by:  As directed    Diet - low sodium heart healthy    Complete by:  As directed    Driving Restrictions    Complete by:  As directed    Do not drive for 10 days   Increase activity slowly    Complete by:  As directed    Leave dressing on - Keep it clean, dry, and intact until clinic visit    Complete by:  As directed    Lifting restrictions    Complete by:  As directed    Do not lift more than 10 pounds for 6 weeks   Sexual Activity Restrictions    Complete by:  As directed    No sex for 8 weeks     Allergies as of 06/18/2017      Reactions   Compazine [prochlorperazine Maleate] Other (See Comments)   Nausea and muscle spasms      Medication List    STOP taking these medications   metroNIDAZOLE 500 MG tablet Commonly known as:  FLAGYL   naproxen 250 MG tablet Commonly known as:   NAPROSYN   ondansetron 4 MG disintegrating tablet Commonly known as:  ZOFRAN ODT     TAKE these medications   albuterol 108 (90 Base) MCG/ACT inhaler Commonly known as:  PROVENTIL HFA;VENTOLIN HFA Inhale 2 puffs into the lungs every 6 (six) hours as needed for wheezing or shortness of breath.   clonazePAM 0.5 MG tablet Commonly known as:  KLONOPIN Take 0.5 mg by mouth 4 (four) times daily.   fluticasone 50 MCG/ACT nasal spray Commonly known as:  FLONASE Place 2 sprays into both nostrils daily as needed (for allergies.).   gabapentin 600 MG tablet Commonly known as:  NEURONTIN Take 600 mg by mouth 4 (four) times daily as needed (for pain.).   gabapentin 300 MG capsule Commonly known as:  NEURONTIN Take 300 mg by mouth 4 (four) times daily as needed (for pain.).   ketorolac 10 MG tablet Commonly known as:  TORADOL Take 1 tablet (10 mg total) by mouth every 6 (six) hours as needed. What changed:  Another medication with the same name was added. Make sure you understand how and when to take each.   ketorolac 10 MG tablet Commonly known as:  TORADOL Take 1 tablet (10 mg total) by mouth every 8 (eight) hours as needed. What changed:  You were already taking  a medication with the same name, and this prescription was added. Make sure you understand how and when to take each.   methylphenidate 10 MG 24 hr capsule Commonly known as:  RITALIN LA Take 10 mg by mouth daily.   omeprazole 40 MG capsule Commonly known as:  PRILOSEC Take 40 mg by mouth See admin instructions. Take 1 capsule (40 mg) scheduled by mouth every morning, & may take an additional dose if needed for acid reflux/stomach issues   ondansetron 8 MG tablet Commonly known as:  ZOFRAN Take 1 tablet (8 mg total) by mouth every 6 (six) hours as needed for nausea.   oxyCODONE-acetaminophen 7.5-325 MG tablet Commonly known as:  PERCOCET Take 1-2 tablets by mouth every 6 (six) hours as needed.   topiramate 100 MG  tablet Commonly known as:  TOPAMAX Take 100 mg by mouth at bedtime.            Discharge Care Instructions        Start     Ordered   06/18/17 0000  ondansetron (ZOFRAN) 8 MG tablet  Every 6 hours PRN     06/18/17 1331   06/18/17 0000  Diet - low sodium heart healthy     06/18/17 1331   06/18/17 0000  Increase activity slowly     06/18/17 1331   06/18/17 0000  Driving Restrictions    Comments:  Do not drive for 10 days   77/82/42 1331   06/18/17 0000  Lifting restrictions    Comments:  Do not lift more than 10 pounds for 6 weeks   06/18/17 1331   06/18/17 0000  Sexual Activity Restrictions    Comments:  No sex for 8 weeks   06/18/17 1331   06/18/17 0000  Leave dressing on - Keep it clean, dry, and intact until clinic visit     06/18/17 1331   06/18/17 0000  Call MD for:  temperature >100.4     06/18/17 1331   06/18/17 0000  Call MD for:  persistant nausea and vomiting     06/18/17 1331   06/18/17 0000  Call MD for:  severe uncontrolled pain     06/18/17 1331   06/18/17 0000  oxyCODONE-acetaminophen (PERCOCET) 7.5-325 MG tablet  Every 6 hours PRN     06/18/17 1331   06/18/17 0000  ketorolac (TORADOL) 10 MG tablet  Every 8 hours PRN     06/18/17 1331     Follow-up Information    Florian Buff, MD Follow up.   Specialties:  Obstetrics and Gynecology, Radiology Contact information: [place] [id number] [phone]           Signed: Florian Buff 06/18/2017, 1:32 PM

## 2017-06-23 ENCOUNTER — Encounter (HOSPITAL_COMMUNITY): Payer: Self-pay | Admitting: Obstetrics & Gynecology

## 2017-06-24 ENCOUNTER — Encounter: Payer: Self-pay | Admitting: *Deleted

## 2017-06-24 ENCOUNTER — Encounter: Payer: Self-pay | Admitting: Obstetrics and Gynecology

## 2017-06-24 ENCOUNTER — Encounter: Payer: Medicare Other | Admitting: Obstetrics and Gynecology

## 2017-06-24 NOTE — Progress Notes (Signed)
Patient did not keep GYN appointment for 06/24/2017.  Durene Romans MD Attending [facility] for [employer] [facility])

## 2017-06-24 NOTE — Progress Notes (Signed)
Emily Vasquez did not keep appointment she requested today for cyst. No need to call patient per Dr. Ilda Basset. May reschedule if she calls.

## 2017-06-25 ENCOUNTER — Encounter: Payer: Self-pay | Admitting: Obstetrics & Gynecology

## 2017-06-25 ENCOUNTER — Ambulatory Visit (INDEPENDENT_AMBULATORY_CARE_PROVIDER_SITE_OTHER): Payer: Medicare Other | Admitting: Obstetrics & Gynecology

## 2017-06-25 VITALS — BP 122/70 | HR 87 | Ht 70.5 in | Wt 177.0 lb

## 2017-06-25 DIAGNOSIS — Z9889 Other specified postprocedural states: Secondary | ICD-10-CM

## 2017-06-25 DIAGNOSIS — Z9079 Acquired absence of other genital organ(s): Secondary | ICD-10-CM

## 2017-06-25 DIAGNOSIS — Z90722 Acquired absence of ovaries, bilateral: Secondary | ICD-10-CM

## 2017-06-25 DIAGNOSIS — Z90710 Acquired absence of both cervix and uterus: Secondary | ICD-10-CM

## 2017-06-25 MED ORDER — OXYCODONE-ACETAMINOPHEN 5-325 MG PO TABS: 1.0000 | ORAL_TABLET | ORAL | 0 refills | Status: DC | PRN

## 2017-06-25 MED ORDER — ESTRADIOL 0.1 MG/24HR TD PTTW: 1.0000 | MEDICATED_PATCH | TRANSDERMAL | 12 refills | Status: DC

## 2017-06-25 NOTE — Progress Notes (Signed)
  HPI: Patient returns for routine postoperative follow-up having undergone TAH LSO for left ovarian mass on 06/17/2017.  The patient's immediate postoperative recovery has been unremarkable. Since hospital discharge the patient reports normal post op progression.   Current Outpatient Prescriptions: albuterol (PROVENTIL HFA;VENTOLIN HFA) 108 (90 Base) MCG/ACT inhaler, Inhale 2 puffs into the lungs every 6 (six) hours as needed for wheezing or shortness of breath., Disp: , Rfl:  clonazePAM (KLONOPIN) 0.5 MG tablet, Take 0.5 mg by mouth 4 (four) times daily., Disp: , Rfl: 1 fluticasone (FLONASE) 50 MCG/ACT nasal spray, Place 2 sprays into both nostrils daily as needed (for allergies.). , Disp: , Rfl:  gabapentin (NEURONTIN) 300 MG capsule, Take 300 mg by mouth 4 (four) times daily as needed (for pain.). , Disp: , Rfl:  gabapentin (NEURONTIN) 600 MG tablet, Take 600 mg by mouth 4 (four) times daily as needed (for pain.). , Disp: , Rfl:  ketorolac (TORADOL) 10 MG tablet, Take 1 tablet (10 mg total) by mouth every 8 (eight) hours as needed., Disp: 15 tablet, Rfl: 0 methylphenidate (RITALIN LA) 10 MG 24 hr capsule, Take 10 mg by mouth daily., Disp: , Rfl:  omeprazole (PRILOSEC) 40 MG capsule, Take 40 mg by mouth See admin instructions. Take 1 capsule (40 mg) scheduled by mouth every morning, & may take an additional dose if needed for acid reflux/stomach issues, Disp: , Rfl:  ondansetron (ZOFRAN) 8 MG tablet, Take 1 tablet (8 mg total) by mouth every 6 (six) hours as needed for nausea., Disp: 20 tablet, Rfl: 0 oxyCODONE-acetaminophen (PERCOCET) 7.5-325 MG tablet, Take 1-2 tablets by mouth every 6 (six) hours as needed., Disp: 40 tablet, Rfl: 0 topiramate (TOPAMAX) 100 MG tablet, Take 100 mg by mouth at bedtime., Disp: , Rfl: 1 ketorolac (TORADOL) 10 MG tablet, Take 1 tablet (10 mg total) by mouth every 6 (six) hours as needed. (Patient not taking: Reported on 06/25/2017), Disp: 20 tablet, Rfl: 0  No  current facility-administered medications for this visit.     Blood pressure 122/70, pulse 87, height 5' 10.5" (1.791 m), weight 177 lb (80.3 kg), last menstrual period 05/12/2017.  Physical Exam: Incision clean dry intact x 4 Abdomen soft normal post op exam Cuff healing well  Diagnostic Tests:   Pathology: Benign, see path report  Impression: S/p TAH LSO  Plan: Routine post op care  Meds ordered this encounter  Medications  . oxyCODONE-acetaminophen (PERCOCET/ROXICET) 5-325 MG tablet    Sig: Take 1 tablet by mouth every 4 (four) hours as needed for severe pain.    Dispense:  20 tablet    Refill:  0  . estradiol (VIVELLE-DOT) 0.1 MG/24HR patch    Sig: Place 1 patch (0.1 mg total) onto the skin 2 (two) times a week.    Dispense:  8 patch    Refill:  12     Follow up: 4  weeks  Florian Buff, MD

## 2017-07-01 ENCOUNTER — Encounter (HOSPITAL_COMMUNITY): Payer: Self-pay | Admitting: *Deleted

## 2017-07-01 ENCOUNTER — Emergency Department (HOSPITAL_COMMUNITY)
Admission: EM | Admit: 2017-07-01 | Discharge: 2017-07-01 | Disposition: A | Discharge status: Home / Self Care | Payer: Medicare Other | Attending: Emergency Medicine | Admitting: Emergency Medicine

## 2017-07-01 ENCOUNTER — Telehealth: Payer: Self-pay | Admitting: Obstetrics & Gynecology

## 2017-07-01 DIAGNOSIS — L270 Generalized skin eruption due to drugs and medicaments taken internally: Secondary | ICD-10-CM | POA: Insufficient documentation

## 2017-07-01 DIAGNOSIS — T50905A Adverse effect of unspecified drugs, medicaments and biological substances, initial encounter: Secondary | ICD-10-CM

## 2017-07-01 DIAGNOSIS — J45909 Unspecified asthma, uncomplicated: Secondary | ICD-10-CM | POA: Insufficient documentation

## 2017-07-01 DIAGNOSIS — F17210 Nicotine dependence, cigarettes, uncomplicated: Secondary | ICD-10-CM | POA: Diagnosis not present

## 2017-07-01 DIAGNOSIS — L298 Other pruritus: Secondary | ICD-10-CM

## 2017-07-01 DIAGNOSIS — R21 Rash and other nonspecific skin eruption: Secondary | ICD-10-CM | POA: Diagnosis present

## 2017-07-01 DIAGNOSIS — Z79899 Other long term (current) drug therapy: Secondary | ICD-10-CM | POA: Insufficient documentation

## 2017-07-01 LAB — URINALYSIS, ROUTINE W REFLEX MICROSCOPIC
BILIRUBIN URINE: NEGATIVE
GLUCOSE, UA: NEGATIVE mg/dL
HGB URINE DIPSTICK: NEGATIVE
KETONES UR: NEGATIVE mg/dL
LEUKOCYTES UA: NEGATIVE
Nitrite: NEGATIVE
PH: 7 (ref 5.0–8.0)
PROTEIN: NEGATIVE mg/dL
Specific Gravity, Urine: 1.016 (ref 1.005–1.030)

## 2017-07-01 MED ORDER — PREDNISONE 10 MG PO TABS: ORAL_TABLET | ORAL | 0 refills | Status: DC

## 2017-07-01 MED ORDER — DEXAMETHASONE SODIUM PHOSPHATE 10 MG/ML IJ SOLN
10.0000 mg | Freq: Once | INTRAMUSCULAR | Status: AC
  Administered 2017-07-01: 10 mg via INTRAMUSCULAR
  Filled 2017-07-01: qty 1

## 2017-07-01 MED ORDER — DIPHENHYDRAMINE HCL 25 MG PO CAPS
50.0000 mg | ORAL_CAPSULE | Freq: Once | ORAL | Status: AC
  Administered 2017-07-01: 50 mg via ORAL
  Filled 2017-07-01: qty 2

## 2017-07-01 NOTE — ED Triage Notes (Signed)
Pt c/o small amount of vaginal bleeding and red itchy rash that started a few days. Pt reports she had a hysterectomy 2.5 weeks ago. Pt was started on Estrogen patches 3 days ago. Pt also c/o dysuria and vaginal itching.

## 2017-07-01 NOTE — Discharge Instructions (Signed)
As discussed I suspect your itching is an allergic reaction to your estrogen patch.  I advised to discontinue using this until you are further advised by Dr. Elonda Husky.  Take your next dose of prednisone tomorrow morning, you may take Benadryl every 6 hours if needed for itching relief.  I am not discerned about the trace of spotting as discussed, however if you have any worsened vaginal bleeding call Dr. Elonda Husky or return here immediately.

## 2017-07-01 NOTE — Telephone Encounter (Signed)
Patient called stating that she had surgery with Dr. Elonda Husky recently and is having a reaction to something Pt states that it itches all over and that it is very uncomfortable for her. Pt would like to know if that is normal. Please contact pt

## 2017-07-01 NOTE — Telephone Encounter (Signed)
Pt called stating that she had started itching all over and wasn't sure if it was from the patch that she had been prescribed. She stated that she also had some spotting that she hadnt had since surgery and she was concerned. She states that she is currently at the ED for treatment.

## 2017-07-02 ENCOUNTER — Telehealth: Payer: Self-pay | Admitting: Obstetrics & Gynecology

## 2017-07-02 ENCOUNTER — Telehealth: Payer: Self-pay | Admitting: *Deleted

## 2017-07-02 NOTE — Telephone Encounter (Signed)
Left message letting pt know hives could be from the patch per Dr. Elonda Husky. Take patch off and see if it gets better. Take Benadryl prn. If hives don't come back, call office and let Dr. Elonda Husky know so he can prescribe something else. Advised to call if any questions. [place]

## 2017-07-02 NOTE — ED Provider Notes (Signed)
[facility] DEPT Provider Note   CSN: [id number] Arrival date & time: 07/01/17  1011     History   Chief Complaint Chief Complaint  Patient presents with  . Rash  . Vaginal Bleeding    HPI Emily Vasquez is a 36 y.o. female with past medical hx as outlined below and currently 2 weeks out from an abdominal total hysterectomy presenting with a pruritic rash since shortly after placing her first estrogen patch 3 days ago.  She noticed initially just localized itching which has quickly spread to all over itching along with raised welts where there has been more intense itching (and scratching).  She has since removed the estrogen patch and has taken benadryl but without relief of sx.  She denies any other new exposures.  Additionally she has noticed light vaginal spotting since yesterday and has noticed slight burning with urination, but denies urinary frequency, hematuria, back pain, abdominal or pelvic pain, also with no n/v fevers, sob or other sx. She has contacted her surgeons office but has not yet obtained advice from them..  The history is provided by the patient.    Past Medical History:  Diagnosis Date  . ADD (attention deficit disorder)   . Anxiety   . Asthma   . Degenerative disc disease, cervical   . Fibromyalgia   . GERD (gastroesophageal reflux disease)   . Low back pain   . Ovarian cyst   . Ovarian torsion     Patient Active Problem List   Diagnosis Date Noted  . S/P hysterectomy 06/17/2017  . Complex ovarian cyst 05/28/2017  . Enlarged ovary 05/28/2017  . Pelvic pain 05/28/2017    Past Surgical History:  Procedure Laterality Date  . ABDOMINAL HYSTERECTOMY Left 06/17/2017   Procedure: HYSTERECTOMY ABDOMINAL; LEFT SALPINGO OOPHORECTOMY;  Surgeon: Florian Buff, MD;  Location: [facility];  Service: Gynecology;  Laterality: Left;  . CESAREAN SECTION    . HAND SURGERY    . LAPAROSCOPIC SALPINGO OOPHERECTOMY Left 06/17/2017   Procedure: diagnostic laproscopy;   Surgeon: Florian Buff, MD;  Location: [facility];  Service: Gynecology;  Laterality: Left;  decision to open at 1330  . OOPHORECTOMY    . TUBAL LIGATION      OB History    Gravida Para Term Preterm AB Living   2 2 2     2    SAB TAB Ectopic Multiple Live Births           2       Home Medications    Prior to Admission medications   Medication Sig Start Date End Date Taking? Authorizing Provider  albuterol (PROVENTIL HFA;VENTOLIN HFA) 108 (90 Base) MCG/ACT inhaler Inhale 2 puffs into the lungs every 6 (six) hours as needed for wheezing or shortness of breath.    [provider]  clonazePAM (KLONOPIN) 0.5 MG tablet Take 0.5 mg by mouth 4 (four) times daily. 05/11/17   [provider]  estradiol (VIVELLE-DOT) 0.1 MG/24HR patch Place 1 patch (0.1 mg total) onto the skin 2 (two) times a week. 06/25/17   Florian Buff, MD  fluticasone (FLONASE) 50 MCG/ACT nasal spray Place 2 sprays into both nostrils daily as needed (for allergies.).     [provider]  gabapentin (NEURONTIN) 300 MG capsule Take 300 mg by mouth 4 (four) times daily as needed (for pain.).     [provider]  gabapentin (NEURONTIN) 600 MG tablet Take 600 mg by mouth 4 (four) times daily as needed (  for pain.).     [provider]  ketorolac (TORADOL) 10 MG tablet Take 1 tablet (10 mg total) by mouth every 6 (six) hours as needed. Patient not taking: Reported on 06/25/2017 05/28/17   Derrek Monaco A, NP  ketorolac (TORADOL) 10 MG tablet Take 1 tablet (10 mg total) by mouth every 8 (eight) hours as needed. 06/18/17   Florian Buff, MD  methylphenidate (RITALIN LA) 10 MG 24 hr capsule Take 10 mg by mouth daily.    [provider]  omeprazole (PRILOSEC) 40 MG capsule Take 40 mg by mouth See admin instructions. Take 1 capsule (40 mg) scheduled by mouth every morning, & may take an additional dose if needed for acid reflux/stomach issues    [provider]  ondansetron  (ZOFRAN) 8 MG tablet Take 1 tablet (8 mg total) by mouth every 6 (six) hours as needed for nausea. 06/18/17   Florian Buff, MD  oxyCODONE-acetaminophen (PERCOCET/ROXICET) 5-325 MG tablet Take 1 tablet by mouth every 4 (four) hours as needed for severe pain. 06/25/17   Florian Buff, MD  predniSONE (DELTASONE) 10 MG tablet Take 6 tablets day one, 5 tablets day two, 4 tablets day three, 3 tablets day four, 2 tablets day five, then 1 tablet day six 07/02/17   Ryleeann Urquiza, Almyra Free, PA-C  topiramate (TOPAMAX) 100 MG tablet Take 100 mg by mouth at bedtime. 05/07/17   [provider]    Family History Family History  Problem Relation Age of Onset  . Alcohol abuse Paternal Grandfather   . Mental illness Maternal Grandmother   . Diabetes Maternal Grandmother   . Heart attack Maternal Grandfather   . Heart attack Father   . High Cholesterol Father   . Diabetes Brother   . Stroke Brother   . Asthma Daughter   . Asthma Son     Social History Social History  Substance Use Topics  . Smoking status: Current Every Day Smoker    Packs/day: 0.50    Years: 26.00    Types: Cigarettes  . Smokeless tobacco: Never Used  . Alcohol use Yes     Comment: occasional     Allergies   Compazine [prochlorperazine maleate]   Review of Systems Review of Systems  Constitutional: Negative for chills and fever.  HENT: Negative for congestion and sore throat.   Eyes: Negative.   Respiratory: Negative for shortness of breath.   Cardiovascular: Negative for chest pain.  Gastrointestinal: Negative for abdominal pain, diarrhea, nausea and vomiting.  Genitourinary: Positive for dysuria. Negative for flank pain, frequency, hematuria, pelvic pain, urgency and vaginal discharge.  Musculoskeletal: Negative for arthralgias, joint swelling and neck pain.  Skin: Positive for rash. Negative for color change and wound.  Neurological: Negative for dizziness, weakness, light-headedness, numbness and headaches.    Psychiatric/Behavioral: Negative.      Physical Exam Updated Vital Signs BP 131/77 (BP Location: Left Arm)   Pulse 73   Temp 97.6 F (36.4 C) (Oral)   Resp 16   Ht 5' 10.5" (1.791 m)   Wt 80.3 kg (177 lb)   LMP 05/12/2017   SpO2 100%   BMI 25.04 kg/m   Physical Exam  Constitutional: She appears well-developed and well-nourished.  HENT:  Head: Normocephalic and atraumatic.  Eyes: Conjunctivae are normal.  Neck: Normal range of motion.  Cardiovascular: Normal rate, regular rhythm, normal heart sounds and intact distal pulses.   Pulmonary/Chest: Effort normal and breath sounds normal. She has no wheezes.  Abdominal:  Soft. Bowel sounds are normal. There is no tenderness.  Genitourinary: Vagina normal. There is no rash or tenderness on the right labia. There is no rash or tenderness on the left labia. No erythema or tenderness in the vagina. No vaginal discharge found.  Genitourinary Comments: Gentle speculum exam performed only. Scant amount of serosanguinous fluid in several vaginal folds. No active bleeding noted.   Non tender.  Musculoskeletal: Normal range of motion.  Neurological: She is alert.  Skin: Skin is warm and dry. Rash noted. Rash is urticarial.  Small urticarial lesions at sites of increased erythema and excoriations, esp bilateral thighs and flank, upper thighs.  No other rash or skin changes noted.  Psychiatric: She has a normal mood and affect.  Nursing note and vitals reviewed.    ED Treatments / Results  Labs (all labs ordered are listed, but only abnormal results are displayed) Labs Reviewed  URINALYSIS, ROUTINE W REFLEX MICROSCOPIC    EKG  EKG Interpretation None       Radiology No results found.  Procedures Procedures (including critical care time)  Medications Ordered in ED Medications  diphenhydrAMINE (BENADRYL) capsule 50 mg (50 mg Oral Given 07/01/17 1313)  dexamethasone (DECADRON) injection 10 mg (10 mg Intramuscular Given 07/01/17  1313)     Initial Impression / Assessment and Plan / ED Course  I have reviewed the triage vital signs and the nursing notes.  Pertinent labs & imaging results that were available during my care of the patient were reviewed by me and considered in my medical decision making (see chart for details).     Pt given decadron and benadryl here, prednisone taper prescribed.  Suspect allergic rxn to estrogen patch. Advised to continue discontinuation until further advice from Dr. Elonda Husky. Trace blood in vaginal vault, not concerning for active or dangerous post operative bleeding.  Pt advised close watch and f/u here or with Dr. Elonda Husky for persistent or worsened sx.    Final Clinical Impressions(s) / ED Diagnoses   Final diagnoses:  Itching due to drug    New Prescriptions Discharge Medication List as of 07/01/2017  1:24 PM    START taking these medications   Details  predniSONE (DELTASONE) 10 MG tablet Take 6 tablets day one, 5 tablets day two, 4 tablets day three, 3 tablets day four, 2 tablets day five, then 1 tablet day six, Print         Landis Martins 07/02/17 2145    Isla Pence, MD 07/03/17 1601

## 2017-07-02 NOTE — Telephone Encounter (Signed)
Spoke with pt. Pt states she is still itching all over and has hives. Removed patch 2 days ago. Itching started when she started patch. Pt states when she scratches, that's when the hives break out. Please advise. Thanks!! Emily Vasquez

## 2017-07-02 NOTE — Telephone Encounter (Signed)
Left message x 1. JSY 

## 2017-07-03 ENCOUNTER — Other Ambulatory Visit: Payer: Self-pay | Admitting: Obstetrics & Gynecology

## 2017-07-03 ENCOUNTER — Emergency Department (HOSPITAL_COMMUNITY)
Admission: EM | Admit: 2017-07-03 | Discharge: 2017-07-03 | Disposition: A | Discharge status: Home / Self Care | Payer: Medicare Other | Attending: Emergency Medicine | Admitting: Emergency Medicine

## 2017-07-03 ENCOUNTER — Encounter (HOSPITAL_COMMUNITY): Payer: Self-pay

## 2017-07-03 DIAGNOSIS — F419 Anxiety disorder, unspecified: Secondary | ICD-10-CM

## 2017-07-03 DIAGNOSIS — R21 Rash and other nonspecific skin eruption: Secondary | ICD-10-CM | POA: Diagnosis present

## 2017-07-03 DIAGNOSIS — J45909 Unspecified asthma, uncomplicated: Secondary | ICD-10-CM | POA: Insufficient documentation

## 2017-07-03 DIAGNOSIS — Z79899 Other long term (current) drug therapy: Secondary | ICD-10-CM | POA: Diagnosis not present

## 2017-07-03 DIAGNOSIS — F17210 Nicotine dependence, cigarettes, uncomplicated: Secondary | ICD-10-CM | POA: Diagnosis not present

## 2017-07-03 DIAGNOSIS — L509 Urticaria, unspecified: Secondary | ICD-10-CM | POA: Diagnosis not present

## 2017-07-03 MED ORDER — METHYLPREDNISOLONE SODIUM SUCC 125 MG IJ SOLR
125.0000 mg | Freq: Once | INTRAMUSCULAR | Status: AC
Start: 2017-07-03 — End: 2017-07-03
  Administered 2017-07-03: 125 mg via INTRAMUSCULAR
  Filled 2017-07-03: qty 2

## 2017-07-03 MED ORDER — FAMOTIDINE 20 MG PO TABS: 20.0000 mg | ORAL_TABLET | Freq: Two times a day (BID) | ORAL | 0 refills | Status: DC

## 2017-07-03 MED ORDER — FAMOTIDINE 20 MG PO TABS
20.0000 mg | ORAL_TABLET | Freq: Once | ORAL | Status: AC
Start: 2017-07-03 — End: 2017-07-03
  Administered 2017-07-03: 20 mg via ORAL
  Filled 2017-07-03: qty 1

## 2017-07-03 MED ORDER — HYDROXYZINE HCL 25 MG PO TABS: 25.0000 mg | ORAL_TABLET | Freq: Four times a day (QID) | ORAL | 0 refills | Status: AC | PRN

## 2017-07-03 MED ORDER — HYDROXYZINE HCL 25 MG PO TABS
50.0000 mg | ORAL_TABLET | Freq: Once | ORAL | Status: AC
  Administered 2017-07-03: 50 mg via ORAL
  Filled 2017-07-03: qty 2

## 2017-07-03 MED ORDER — PREDNISONE 20 MG PO TABS: ORAL_TABLET | ORAL | 0 refills | Status: DC

## 2017-07-03 MED ORDER — ZOLPIDEM TARTRATE 10 MG PO TABS: 10.0000 mg | ORAL_TABLET | Freq: Every evening | ORAL | 3 refills | Status: DC | PRN

## 2017-07-03 NOTE — ED Notes (Signed)
edp in to talk with pt

## 2017-07-03 NOTE — ED Provider Notes (Signed)
[facility] DEPT Provider Note   CSN: [id number] Arrival date & time: 07/03/17  0440     History   Chief Complaint Chief Complaint  Patient presents with  . Urticaria    HPI Emily Vasquez is a 36 y.o. female.  HPI Patient several weeks out from hysterectomy. Placed on estrogen patch days ago. Developed diffuse itching rash. Seen in emergency department on 9/19. Given Solu-Medrol and prescription for prednisone. Patient has yet to fill prescription. States the itching rash has persisted. She's been taking Benadryl with little relief.No facial swelling or difficulty breathing. No fever or chills. No other known new exposures.  Past Medical History:  Diagnosis Date  . ADD (attention deficit disorder)   . Anxiety   . Asthma   . Degenerative disc disease, cervical   . Fibromyalgia   . GERD (gastroesophageal reflux disease)   . Low back pain   . Ovarian cyst   . Ovarian torsion     Patient Active Problem List   Diagnosis Date Noted  . S/P hysterectomy 06/17/2017  . Complex ovarian cyst 05/28/2017  . Enlarged ovary 05/28/2017  . Pelvic pain 05/28/2017    Past Surgical History:  Procedure Laterality Date  . ABDOMINAL HYSTERECTOMY Left 06/17/2017   Procedure: HYSTERECTOMY ABDOMINAL; LEFT SALPINGO OOPHORECTOMY;  Surgeon: Florian Buff, MD;  Location: [facility];  Service: Gynecology;  Laterality: Left;  . CESAREAN SECTION    . HAND SURGERY    . LAPAROSCOPIC SALPINGO OOPHERECTOMY Left 06/17/2017   Procedure: diagnostic laproscopy;  Surgeon: Florian Buff, MD;  Location: [facility];  Service: Gynecology;  Laterality: Left;  decision to open at 1330  . OOPHORECTOMY    . TUBAL LIGATION      OB History    Gravida Para Term Preterm AB Living   2 2 2     2    SAB TAB Ectopic Multiple Live Births           2       Home Medications    Prior to Admission medications   Medication Sig Start Date End Date Taking? Authorizing Provider  albuterol (PROVENTIL HFA;VENTOLIN HFA) 108  (90 Base) MCG/ACT inhaler Inhale 2 puffs into the lungs every 6 (six) hours as needed for wheezing or shortness of breath.   Yes [provider]  clonazePAM (KLONOPIN) 0.5 MG tablet Take 0.5 mg by mouth 4 (four) times daily. 05/11/17  Yes [provider]  estradiol (VIVELLE-DOT) 0.1 MG/24HR patch Place 1 patch (0.1 mg total) onto the skin 2 (two) times a week. 06/25/17  Yes Florian Buff, MD  fluticasone (FLONASE) 50 MCG/ACT nasal spray Place 2 sprays into both nostrils daily as needed (for allergies.).    Yes [provider]  gabapentin (NEURONTIN) 300 MG capsule Take 300 mg by mouth 4 (four) times daily as needed (for pain.).    Yes [provider]  gabapentin (NEURONTIN) 600 MG tablet Take 600 mg by mouth 4 (four) times daily as needed (for pain.).    Yes [provider]  ketorolac (TORADOL) 10 MG tablet Take 1 tablet (10 mg total) by mouth every 8 (eight) hours as needed. 06/18/17  Yes Florian Buff, MD  methylphenidate (RITALIN LA) 10 MG 24 hr capsule Take 10 mg by mouth daily.   Yes [provider]  omeprazole (PRILOSEC) 40 MG capsule Take 40 mg by mouth See admin instructions. Take 1 capsule (40 mg) scheduled by mouth every morning, & may take an additional dose  if needed for acid reflux/stomach issues   Yes [provider]  ondansetron (ZOFRAN) 8 MG tablet Take 1 tablet (8 mg total) by mouth every 6 (six) hours as needed for nausea. 06/18/17  Yes Florian Buff, MD  oxyCODONE-acetaminophen (PERCOCET/ROXICET) 5-325 MG tablet Take 1 tablet by mouth every 4 (four) hours as needed for severe pain. 06/25/17  Yes Florian Buff, MD  topiramate (TOPAMAX) 100 MG tablet Take 100 mg by mouth at bedtime. 05/07/17  Yes [provider]  famotidine (PEPCID) 20 MG tablet Take 1 tablet (20 mg total) by mouth 2 (two) times daily. 07/03/17   Julianne Rice, MD  hydrOXYzine (ATARAX/VISTARIL) 25 MG tablet Take 1 tablet (25 mg total) by mouth every 6  (six) hours as needed for itching. 07/03/17   Julianne Rice, MD  predniSONE (DELTASONE) 20 MG tablet 3 tabs po day one, then 2 po daily x 4 days 07/03/17   Julianne Rice, MD  zolpidem (AMBIEN) 10 MG tablet Take 1 tablet (10 mg total) by mouth at bedtime as needed for sleep. 07/03/17 07/18/17  Florian Buff, MD    Family History Family History  Problem Relation Age of Onset  . Alcohol abuse Paternal Grandfather   . Mental illness Maternal Grandmother   . Diabetes Maternal Grandmother   . Heart attack Maternal Grandfather   . Heart attack Father   . High Cholesterol Father   . Diabetes Brother   . Stroke Brother   . Asthma Daughter   . Asthma Son     Social History Social History  Substance Use Topics  . Smoking status: Current Every Day Smoker    Packs/day: 0.50    Years: 26.00    Types: Cigarettes  . Smokeless tobacco: Never Used  . Alcohol use Yes     Comment: occasional     Allergies   Compazine [prochlorperazine maleate]   Review of Systems Review of Systems  Constitutional: Negative for chills and fever.  HENT: Negative for facial swelling and trouble swallowing.   Respiratory: Negative for cough, shortness of breath and wheezing.   Cardiovascular: Negative for chest pain and palpitations.  Gastrointestinal: Negative for abdominal pain, nausea and vomiting.  Musculoskeletal: Negative for back pain, myalgias, neck pain and neck stiffness.  Skin: Positive for rash. Negative for wound.  Neurological: Negative for dizziness, weakness, light-headedness, numbness and headaches.  Psychiatric/Behavioral: The patient is nervous/anxious.   All other systems reviewed and are negative.    Physical Exam Updated Vital Signs BP 117/73   Pulse 88   Temp 98 F (36.7 C)   Resp 19   LMP 05/12/2017   SpO2 97%   Physical Exam  Constitutional: She is oriented to person, place, and time. She appears well-developed and well-nourished.  Very anxious and tearful  HENT:    Head: Normocephalic and atraumatic.  Mouth/Throat: Oropharynx is clear and moist.  Eyes: Pupils are equal, round, and reactive to light. EOM are normal.  Neck: Normal range of motion. Neck supple.  Cardiovascular: Normal rate and regular rhythm.   Pulmonary/Chest: Effort normal and breath sounds normal.  Abdominal: Soft. Bowel sounds are normal. There is no tenderness. There is no rebound and no guarding.  Benign abdominal exam  Musculoskeletal: Normal range of motion. She exhibits no edema or tenderness.  Neurological: She is alert and oriented to person, place, and time.  Skin: Skin is warm and dry. Rash noted. No erythema.  Diffuse erythematous raised plaques to the trunk, consistent with urticaria.  Nursing note and vitals reviewed.    ED Treatments / Results  Labs (all labs ordered are listed, but only abnormal results are displayed) Labs Reviewed - No data to display  EKG  EKG Interpretation None       Radiology No results found.  Procedures Procedures (including critical care time)  Medications Ordered in ED Medications  methylPREDNISolone sodium succinate (SOLU-MEDROL) 125 mg/2 mL injection 125 mg (125 mg Intramuscular Given 07/03/17 0756)  hydrOXYzine (ATARAX/VISTARIL) tablet 50 mg (50 mg Oral Given 07/03/17 0754)  famotidine (PEPCID) tablet 20 mg (20 mg Oral Given 07/03/17 5364)     Initial Impression / Assessment and Plan / ED Course  I have reviewed the triage vital signs and the nursing notes.  Pertinent labs & imaging results that were available during my care of the patient were reviewed by me and considered in my medical decision making (see chart for details).     Patient with allergic reaction presumed to be from estrogen patch. No airway compromise. Will treat symptomatically.   Patient appears much more calm. Hives have resolved. Abdominal exam continues to be benign. Seen in the emergency department by her OB/GYN. Return precautions  given. Final Clinical Impressions(s) / ED Diagnoses   Final diagnoses:  Urticaria  Anxiety    New Prescriptions Discharge Medication List as of 07/03/2017 11:24 AM    START taking these medications   Details  famotidine (PEPCID) 20 MG tablet Take 1 tablet (20 mg total) by mouth 2 (two) times daily., Starting Fri 07/03/2017, Print    hydrOXYzine (ATARAX/VISTARIL) 25 MG tablet Take 1 tablet (25 mg total) by mouth every 6 (six) hours as needed for itching., Starting Fri 07/03/2017, Print         Julianne Rice, MD 07/04/17 [phone]

## 2017-07-03 NOTE — ED Notes (Addendum)
Pt still c/o itching and requesting medications, awaiting EDP to go in room

## 2017-07-03 NOTE — Telephone Encounter (Signed)
Patient called very irate stating she has called over the last 4 days with complaints of itching. Was told to remove the patch for a couple of days as advised but is still having issues. She is now currently in the ER but is demanding that Dr Elonda Husky call her back.  She states she is now having nausea, vomiting and stomach pains along with the itching. She was prescribed Solu-Medrol and Prednisone but did not take it due to issues at the pharmacy. Also states she has an infection and wants it to be checked. Patient cursing and raising her voice throughout the entire conversation. Pt informed that I understood that she was upset but it was not necessary for her to be disrespectful on the phone and needed to calm down in order for me to understand her correctly and for Dr Elonda Husky to talk with her.   Spoke to Dr Elonda Husky who stated he would see patient today.   Informed patient that Dr Elonda Husky would see her but patient stated she was sitting in the ER bed and what was she supposed to do. Advised patient to talk with ER staff and to let them know that he would see her or she could stay there and let them work her up. Pt apologetic and tearful stating she just wanted to know what was wrong with her and wanted everything to be ok. Informed patient that we did close at 2 today. Verbalized understanding.

## 2017-07-03 NOTE — ED Notes (Signed)
Dr Elonda Husky in with pt

## 2017-07-03 NOTE — ED Triage Notes (Signed)
Pt had hysterectomy 2 weeks ago and is still taking percocet for same, states she was also started on estrogen patch last Friday, states she started breaking out in hives 3 days ago that has progressed and worsened, was seen here Wednesday for same and given steroids for same.

## 2017-07-06 ENCOUNTER — Encounter: Payer: Self-pay | Admitting: Obstetrics & Gynecology

## 2017-07-06 ENCOUNTER — Ambulatory Visit (INDEPENDENT_AMBULATORY_CARE_PROVIDER_SITE_OTHER): Payer: Medicare Other | Admitting: Obstetrics & Gynecology

## 2017-07-06 VITALS — BP 90/60 | HR 74 | Wt 180.0 lb

## 2017-07-06 DIAGNOSIS — L508 Other urticaria: Secondary | ICD-10-CM

## 2017-07-06 MED ORDER — ALPRAZOLAM 0.5 MG PO TABS: 0.5000 mg | ORAL_TABLET | Freq: Three times a day (TID) | ORAL | 1 refills | Status: DC | PRN

## 2017-07-06 MED ORDER — LEVOCETIRIZINE DIHYDROCHLORIDE 5 MG PO TABS: 5.0000 mg | ORAL_TABLET | Freq: Every evening | ORAL | 1 refills | Status: AC

## 2017-07-06 MED ORDER — ESTRADIOL 2 MG PO TABS: 2.0000 mg | ORAL_TABLET | Freq: Every day | ORAL | 11 refills | Status: DC

## 2017-07-06 NOTE — Progress Notes (Signed)
HPI: Patient returns for routine postoperative follow-up having undergone an abdominal hysterectomy after failed laparoscopic removal of left ovarian mass with of course removal of left ovarian mass on September 12 She's had an interesting postoperative issue about 9 or 10 days postop she began having widespread symptomatic urticaria She is a bit better now after starting on steroids and being on Evista real although on exam exam today she still has urticaria present She does have a pretty extensive history of anxiety and she states to urticaria get worse when as she gets more anxious.  The patient's immediate postoperative recovery has been unremarkable. Since hospital discharge the patient reports as above.   Current Outpatient Prescriptions: clonazePAM (KLONOPIN) 0.5 MG tablet, Take 0.5 mg by mouth 4 (four) times daily., Disp: , Rfl: 1 fluticasone (FLONASE) 50 MCG/ACT nasal spray, Place 2 sprays into both nostrils daily as needed (for allergies.). , Disp: , Rfl:  gabapentin (NEURONTIN) 300 MG capsule, Take 300 mg by mouth 4 (four) times daily as needed (for pain.). , Disp: , Rfl:  gabapentin (NEURONTIN) 600 MG tablet, Take 600 mg by mouth 4 (four) times daily as needed (for pain.). , Disp: , Rfl:  hydrOXYzine (ATARAX/VISTARIL) 25 MG tablet, Take 1 tablet (25 mg total) by mouth every 6 (six) hours as needed for itching., Disp: 20 tablet, Rfl: 0 methylphenidate (RITALIN LA) 10 MG 24 hr capsule, Take 10 mg by mouth daily., Disp: , Rfl:  omeprazole (PRILOSEC) 40 MG capsule, Take 40 mg by mouth See admin instructions. Take 1 capsule (40 mg) scheduled by mouth every morning, & may take an additional dose if needed for acid reflux/stomach issues, Disp: , Rfl:  predniSONE (DELTASONE) 20 MG tablet, 3 tabs po day one, then 2 po daily x 4 days, Disp: 11 tablet, Rfl: 0 topiramate (TOPAMAX) 100 MG tablet, Take 100 mg by mouth at bedtime., Disp: , Rfl: 1 albuterol (PROVENTIL HFA;VENTOLIN HFA) 108 (90 Base)  MCG/ACT inhaler, Inhale 2 puffs into the lungs every 6 (six) hours as needed for wheezing or shortness of breath., Disp: , Rfl:  ALPRAZolam (XANAX) 0.5 MG tablet, Take 1 tablet (0.5 mg total) by mouth 3 (three) times daily as needed for anxiety., Disp: 60 tablet, Rfl: 1 estradiol (ESTRACE) 2 MG tablet, Take 1 tablet (2 mg total) by mouth daily., Disp: 30 tablet, Rfl: 11 estradiol (VIVELLE-DOT) 0.1 MG/24HR patch, Place 1 patch (0.1 mg total) onto the skin 2 (two) times a week. (Patient not taking: Reported on 07/06/2017), Disp: 8 patch, Rfl: 12 famotidine (PEPCID) 20 MG tablet, Take 1 tablet (20 mg total) by mouth 2 (two) times daily. (Patient not taking: Reported on 07/06/2017), Disp: 10 tablet, Rfl: 0 ketorolac (TORADOL) 10 MG tablet, Take 1 tablet (10 mg total) by mouth every 8 (eight) hours as needed. (Patient not taking: Reported on 07/06/2017), Disp: 15 tablet, Rfl: 0 ondansetron (ZOFRAN) 8 MG tablet, Take 1 tablet (8 mg total) by mouth every 6 (six) hours as needed for nausea. (Patient not taking: Reported on 07/06/2017), Disp: 20 tablet, Rfl: 0 oxyCODONE-acetaminophen (PERCOCET/ROXICET) 5-325 MG tablet, Take 1 tablet by mouth every 4 (four) hours as needed for severe pain. (Patient not taking: Reported on 07/06/2017), Disp: 20 tablet, Rfl: 0 zolpidem (AMBIEN) 10 MG tablet, Take 1 tablet (10 mg total) by mouth at bedtime as needed for sleep. (Patient not taking: Reported on 07/06/2017), Disp: 15 tablet, Rfl: 3  No current facility-administered medications for this visit.     Blood pressure 90/60, pulse 74, weight  180 lb (81.6 kg), last menstrual period 05/12/2017.  Physical Exam: Abdomen is soft nontender all her incisions are healing well since then normal postop exam She has urticaria on her arms and trunk few on her legs She states they tend to come out worse when she gets more anxious so is hard to know exactly what the chicken and negative issues  Diagnostic  Tests:   Pathology: Benign  Impression: Status post a failed laparoscopic removal of a large pelvic mass which turned out to be her left ovary which was incarcerated in the pelvis with subsequent abdominal approach hysterectomy and removal of ovary  Postop development of urticaria  Plan: Patient is improved when I saw her the ER a couple days ago but certainly still is a problem Her steroid drawn through Friday it appears I will see her back in 1 week to see how she is doing I've added Xanax and size all We will begin oral estrogen as well  Meds ordered this encounter  Medications  . estradiol (ESTRACE) 2 MG tablet    Sig: Take 1 tablet (2 mg total) by mouth daily.    Dispense:  30 tablet    Refill:  11  . ALPRAZolam (XANAX) 0.5 MG tablet    Sig: Take 1 tablet (0.5 mg total) by mouth 3 (three) times daily as needed for anxiety.    Dispense:  60 tablet    Refill:  1     Follow up: 1  weeks  Florian Buff, MD

## 2017-07-14 ENCOUNTER — Encounter: Payer: Medicare Other | Admitting: Obstetrics & Gynecology

## 2017-07-16 ENCOUNTER — Encounter: Payer: Medicare Other | Admitting: Obstetrics & Gynecology

## 2017-07-23 ENCOUNTER — Encounter: Payer: Medicare Other | Admitting: Obstetrics & Gynecology

## 2017-09-10 ENCOUNTER — Encounter: Payer: Self-pay | Admitting: Obstetrics & Gynecology

## 2017-12-24 ENCOUNTER — Ambulatory Visit: Payer: Medicare Other | Admitting: Obstetrics & Gynecology

## 2018-06-16 DIAGNOSIS — Z029 Encounter for administrative examinations, unspecified: Secondary | ICD-10-CM

## 2020-10-22 ENCOUNTER — Emergency Department (HOSPITAL_COMMUNITY): Payer: Medicare Other

## 2020-10-22 ENCOUNTER — Other Ambulatory Visit: Payer: Self-pay

## 2020-10-22 ENCOUNTER — Emergency Department (HOSPITAL_COMMUNITY)
Admission: EM | Admit: 2020-10-22 | Discharge: 2020-10-22 | Disposition: A | Discharge status: Home / Self Care | Payer: Medicare Other | Attending: Emergency Medicine | Admitting: Emergency Medicine

## 2020-10-22 ENCOUNTER — Encounter (HOSPITAL_COMMUNITY): Payer: Self-pay

## 2020-10-22 DIAGNOSIS — M542 Cervicalgia: Secondary | ICD-10-CM | POA: Insufficient documentation

## 2020-10-22 DIAGNOSIS — J45909 Unspecified asthma, uncomplicated: Secondary | ICD-10-CM | POA: Diagnosis not present

## 2020-10-22 DIAGNOSIS — L539 Erythematous condition, unspecified: Secondary | ICD-10-CM | POA: Diagnosis not present

## 2020-10-22 DIAGNOSIS — R0981 Nasal congestion: Secondary | ICD-10-CM | POA: Diagnosis present

## 2020-10-22 DIAGNOSIS — F17210 Nicotine dependence, cigarettes, uncomplicated: Secondary | ICD-10-CM | POA: Diagnosis not present

## 2020-10-22 DIAGNOSIS — J069 Acute upper respiratory infection, unspecified: Secondary | ICD-10-CM

## 2020-10-22 NOTE — Discharge Instructions (Addendum)
Please read instructions below.  You can alternate Tylenol/acetaminophen and Advil/ibuprofen/Motrin every 4 hours for sore throat, body aches, headache or fever.  Drink plenty of water.  Use saline or flonase nasal spray for congestion. Wash your hands frequently. You have a COVID test pending. Please isolate at home while awaiting your results.  > If your test is negative, stay home until your fever has resolved/your symptoms are improving. > If your test is positive, isolate at home for at least 14 days after the day your symptoms initially began, and THEN at least 24 hours after you are fever-free without the help of medications AND your symptoms are improving.  > If your test is positive, the [facility] infusion clinic will contact you to discuss possibility of scheduling an infusion. Please be aware infusions are limited and appointments are determined on a case-by-case basis. Follow up with Urgent Care for repeat chest xray in 1 week if symptoms persist and your COVID test is negative. This is to monitor for any persistent signs concerning for pneumonia. Return to the ER for significant shortness of breath, uncontrollable vomiting, severe chest pain, or other concerning symptoms.

## 2020-10-22 NOTE — ED Provider Notes (Addendum)
[facility] Provider Note   CSN: [id number] Arrival date & time: 10/22/20  0827     History Chief Complaint  Patient presents with  . Head Congestion    Emily Vasquez is a 40 y.o. female w PMHx asthma, GERD, presenting to the ED with complaint of congestion that began 4 days ago. Symptoms initially began with rhinorrhea, then progressed to congestion and sinus pressure. She also feels some pressure in her left ear. She has had mild intermittent cough and some soreness to her ribs. She smoke tobacco regularly. No fevers or sore throat. She states she gets sinus infections in the past, however they usually improve by now. Does not have a PCP. Has treated symptoms with sudafed.   The history is provided by the patient.       Past Medical History:  Diagnosis Date  . ADD (attention deficit disorder)   . Anxiety   . Asthma   . Degenerative disc disease, cervical   . Fibromyalgia   . GERD (gastroesophageal reflux disease)   . Low back pain   . Ovarian cyst   . Ovarian torsion     Patient Active Problem List   Diagnosis Date Noted  . S/P hysterectomy 06/17/2017  . Complex ovarian cyst 05/28/2017  . Enlarged ovary 05/28/2017  . Pelvic pain 05/28/2017    Past Surgical History:  Procedure Laterality Date  . ABDOMINAL HYSTERECTOMY Left 06/17/2017   Procedure: HYSTERECTOMY ABDOMINAL; LEFT SALPINGO OOPHORECTOMY;  Surgeon: Florian Buff, MD;  Location: [facility];  Service: Gynecology;  Laterality: Left;  . CESAREAN SECTION    . HAND SURGERY    . LAPAROSCOPIC SALPINGO OOPHERECTOMY Left 06/17/2017   Procedure: diagnostic laproscopy;  Surgeon: Florian Buff, MD;  Location: [facility];  Service: Gynecology;  Laterality: Left;  decision to open at 1330  . OOPHORECTOMY    . TUBAL LIGATION       OB History    Gravida  2   Para  2   Term  2   Preterm      AB      Living  2     SAB      IAB      Ectopic      Multiple      Live Births  2            Family History  Problem Relation Age of Onset  . Alcohol abuse Paternal Grandfather   . Mental illness Maternal Grandmother   . Diabetes Maternal Grandmother   . Heart attack Maternal Grandfather   . Heart attack Father   . High Cholesterol Father   . Diabetes Brother   . Stroke Brother   . Asthma Daughter   . Asthma Son     Social History   Tobacco Use  . Smoking status: Current Every Day Smoker    Packs/day: 0.50    Years: 26.00    Pack years: 13.00    Types: Cigarettes  . Smokeless tobacco: Never Used  Substance Use Topics  . Alcohol use: Yes    Comment: occasional  . Drug use: No    Home Medications Prior to Admission medications   Medication Sig Start Date End Date Taking? Authorizing Provider  albuterol (PROVENTIL HFA;VENTOLIN HFA) 108 (90 Base) MCG/ACT inhaler Inhale 2 puffs into the lungs every 6 (six) hours as needed for wheezing or shortness of breath.    [provider]  ALPRAZolam Duanne Moron) 0.5 MG tablet Take 1  tablet (0.5 mg total) by mouth 3 (three) times daily as needed for anxiety. 07/06/17   Florian Buff, MD  clonazePAM (KLONOPIN) 0.5 MG tablet Take 0.5 mg by mouth 4 (four) times daily. 05/11/17   [provider]  estradiol (ESTRACE) 2 MG tablet Take 1 tablet (2 mg total) by mouth daily. 07/06/17   Florian Buff, MD  estradiol (VIVELLE-DOT) 0.1 MG/24HR patch Place 1 patch (0.1 mg total) onto the skin 2 (two) times a week. Patient not taking: Reported on 07/06/2017 06/25/17   Florian Buff, MD  famotidine (PEPCID) 20 MG tablet Take 1 tablet (20 mg total) by mouth 2 (two) times daily. Patient not taking: Reported on 07/06/2017 07/03/17   Julianne Rice, MD  fluticasone [facility]) 50 MCG/ACT nasal spray Place 2 sprays into both nostrils daily as needed (for allergies.).     [provider]  gabapentin (NEURONTIN) 300 MG capsule Take 300 mg by mouth 4 (four) times daily as needed (for pain.).     [provider]  gabapentin  (NEURONTIN) 600 MG tablet Take 600 mg by mouth 4 (four) times daily as needed (for pain.).     [provider]  hydrOXYzine (ATARAX/VISTARIL) 25 MG tablet Take 1 tablet (25 mg total) by mouth every 6 (six) hours as needed for itching. 07/03/17   Julianne Rice, MD  ketorolac (TORADOL) 10 MG tablet Take 1 tablet (10 mg total) by mouth every 8 (eight) hours as needed. Patient not taking: Reported on 07/06/2017 06/18/17   Florian Buff, MD  levocetirizine (XYZAL) 5 MG tablet Take 1 tablet (5 mg total) by mouth every evening. 07/06/17   Florian Buff, MD  methylphenidate (RITALIN LA) 10 MG 24 hr capsule Take 10 mg by mouth daily.    [provider]  omeprazole (PRILOSEC) 40 MG capsule Take 40 mg by mouth See admin instructions. Take 1 capsule (40 mg) scheduled by mouth every morning, & may take an additional dose if needed for acid reflux/stomach issues    [provider]  ondansetron (ZOFRAN) 8 MG tablet Take 1 tablet (8 mg total) by mouth every 6 (six) hours as needed for nausea. Patient not taking: Reported on 07/06/2017 06/18/17   Florian Buff, MD  oxyCODONE-acetaminophen (PERCOCET/ROXICET) 5-325 MG tablet Take 1 tablet by mouth every 4 (four) hours as needed for severe pain. Patient not taking: Reported on 07/06/2017 06/25/17   Florian Buff, MD  predniSONE (DELTASONE) 20 MG tablet 3 tabs po day one, then 2 po daily x 4 days 07/03/17   Julianne Rice, MD  topiramate (TOPAMAX) 100 MG tablet Take 100 mg by mouth at bedtime. 05/07/17   [provider]  zolpidem (AMBIEN) 10 MG tablet Take 1 tablet (10 mg total) by mouth at bedtime as needed for sleep. Patient not taking: Reported on 07/06/2017 07/03/17 07/18/17  Florian Buff, MD    Allergies    Compazine [prochlorperazine maleate]  Review of Systems   Review of Systems  Constitutional: Negative for fever.  HENT: Positive for congestion and ear pain.   Respiratory: Positive for cough. Negative for shortness of  breath.   All other systems reviewed and are negative.   Physical Exam Updated Vital Signs BP (!) 141/102 (BP Location: Right Arm)   Pulse 81   Temp 98 F (36.7 C) (Oral)   Resp 18   Ht 5\' 11"  (1.803 m)   Wt 99.8 kg   LMP 05/12/2017   SpO2 97%  BMI 30.68 kg/m   Physical Exam Vitals and nursing note reviewed.  Constitutional:      General: She is not in acute distress.    Appearance: She is well-developed. She is not ill-appearing.  HENT:     Head: Normocephalic and atraumatic.     Right Ear: Tympanic membrane and ear canal normal.     Left Ear: Tympanic membrane and ear canal normal.     Mouth/Throat:     Mouth: Mucous membranes are moist.     Pharynx: Oropharynx is clear. Posterior oropharyngeal erythema (mild) present.     Comments: Uvula is midline, no trismus.  Tolerating secretions.  No exudates or tonsillar edema Eyes:     Conjunctiva/sclera: Conjunctivae normal.  Cardiovascular:     Rate and Rhythm: Normal rate and regular rhythm.  Pulmonary:     Effort: Pulmonary effort is normal. No respiratory distress.     Breath sounds: Normal breath sounds.  Musculoskeletal:     Cervical back: Normal range of motion and neck supple. Tenderness present.  Lymphadenopathy:     Cervical: Cervical adenopathy (mild b/l anterior cervical adenopathy) present.  Neurological:     Mental Status: She is alert.  Psychiatric:        Mood and Affect: Mood normal.        Behavior: Behavior normal.     ED Results / Procedures / Treatments   Labs (all labs ordered are listed, but only abnormal results are displayed) Labs Reviewed  SARS CORONAVIRUS 2 (TAT 6-24 HRS)    EKG None  Radiology DG Chest Port 1 View  Result Date: 10/22/2020 CLINICAL DATA:  Cough EXAM: PORTABLE CHEST 1 VIEW COMPARISON:  None. FINDINGS: The heart size and mediastinal contours are within normal limits. Mildly coarsened interstitial markings bilaterally, most prevalent within the lung bases. No lobar  consolidation. No pleural effusion or pneumothorax. The visualized skeletal structures are unremarkable. IMPRESSION: Mildly coarsened interstitial markings bilaterally, most prevalent within the lung bases. Findings could reflect bronchitic type lung changes or atypical/viral infection. Electronically Signed   By: Davina Poke D.O.   On: 10/22/2020 13:05    Procedures Procedures (including critical care time)  Medications Ordered in ED Medications - No data to display  ED Course  I have reviewed the triage vital signs and the nursing notes.  Pertinent labs & imaging results that were available during my care of the patient were reviewed by me and considered in my medical decision making (see chart for details).    Pria Klosinski was evaluated in Emergency Department on 10/22/2020 for the symptoms described in the history of present illness. She was evaluated in the context of the global COVID-19 pandemic, which necessitated consideration that the patient might be at risk for infection with the SARS-CoV-2 virus that causes COVID-19. Institutional protocols and algorithms that pertain to the evaluation of patients at risk for COVID-19 are in a state of rapid change based on information released by regulatory bodies including the CDC and federal and state organizations. These policies and algorithms were followed during the patient's care in the ED.  MDM Rules/Calculators/A&P                          Patient presenting to congestion and sinus pressure x 4 days.  She also endorses some intermittent mild cough that is nonproductive.  States she is a smoker.  No fevers.  On exam she is well-appearing in no distress.  She is  afebrile with stable vital signs.  Lungs are clear.  Chest x-ray with likely bronchiitic type changes or atypical/viral infection.  Considering chest x-ray read, COVID swab is ordered.  Seems less likely bacterial pneumonia as patient is quite well-appearing, afebrile, and has  mild symptoms of cough.  Recommend she follow-up in a week with urgent care for repeat imaging if COVID test is negative.  Discussed symptomatic management including nasal sprays, over-the-counter decongestant, oral hydration.  Patient is appropriate for discharge  Discussed care plan with Dr. Roderic Palau.  Discussed results, findings, treatment and follow up. Patient advised of return precautions. Patient verbalized understanding and agreed with plan.  Final Clinical Impression(s) / ED Diagnoses Final diagnoses:  Viral URI with cough    Rx / DC Orders ED Discharge Orders    None       Hellen Shanley, [place] N, PA-C 10/22/20 1336  Patient eloped prior to discussing xray results and COVID swab. Attempted to call patient however call went to generic voicemail message. Message not left due to HIPAA. Will attempt a second call. Patient remain appropriate for discharge.    Arvilla Salada, [place] N, PA-C 10/22/20 1514    Milton Ferguson, MD 10/26/20 [phone]

## 2020-10-22 NOTE — ED Notes (Signed)
Patient left before treatment completed

## 2020-10-22 NOTE — ED Triage Notes (Signed)
Pt presents to ED with complaints of head congestion and ear fullness x couple of days.

## 2021-04-11 ENCOUNTER — Other Ambulatory Visit: Payer: Self-pay

## 2021-04-11 ENCOUNTER — Other Ambulatory Visit (HOSPITAL_COMMUNITY): Payer: Self-pay | Admitting: Gerontology

## 2021-04-11 ENCOUNTER — Other Ambulatory Visit (HOSPITAL_COMMUNITY)
Admission: RE | Admit: 2021-04-11 | Discharge: 2021-04-11 | Disposition: A | Discharge status: Home / Self Care | Payer: Medicare Other | Source: Ambulatory Visit | Attending: Internal Medicine | Admitting: Internal Medicine

## 2021-04-11 ENCOUNTER — Ambulatory Visit (HOSPITAL_COMMUNITY)
Admission: RE | Admit: 2021-04-11 | Discharge: 2021-04-11 | Disposition: A | Discharge status: Home / Self Care | Payer: Medicare Other | Source: Ambulatory Visit | Attending: Gerontology | Admitting: Gerontology

## 2021-04-11 DIAGNOSIS — R945 Abnormal results of liver function studies: Secondary | ICD-10-CM | POA: Insufficient documentation

## 2021-04-11 DIAGNOSIS — R059 Cough, unspecified: Secondary | ICD-10-CM

## 2021-04-11 DIAGNOSIS — K2100 Gastro-esophageal reflux disease with esophagitis, without bleeding: Secondary | ICD-10-CM | POA: Insufficient documentation

## 2021-04-11 DIAGNOSIS — Z79899 Other long term (current) drug therapy: Secondary | ICD-10-CM | POA: Insufficient documentation

## 2021-04-11 DIAGNOSIS — R7303 Prediabetes: Secondary | ICD-10-CM | POA: Insufficient documentation

## 2021-04-11 DIAGNOSIS — E039 Hypothyroidism, unspecified: Secondary | ICD-10-CM | POA: Insufficient documentation

## 2021-04-11 DIAGNOSIS — Z0001 Encounter for general adult medical examination with abnormal findings: Secondary | ICD-10-CM | POA: Insufficient documentation

## 2021-04-11 LAB — HEPATIC FUNCTION PANEL
ALT: 135 U/L — ABNORMAL HIGH (ref 0–44)
AST: 63 U/L — ABNORMAL HIGH (ref 15–41)
Albumin: 4.3 g/dL (ref 3.5–5.0)
Alkaline Phosphatase: 89 U/L (ref 38–126)
Bilirubin, Direct: 0.1 mg/dL (ref 0.0–0.2)
Indirect Bilirubin: 0.6 mg/dL (ref 0.3–0.9)
Total Bilirubin: 0.7 mg/dL (ref 0.3–1.2)
Total Protein: 7.2 g/dL (ref 6.5–8.1)

## 2021-04-11 LAB — BASIC METABOLIC PANEL
Anion gap: 7 (ref 5–15)
BUN: 16 mg/dL (ref 6–20)
CO2: 27 mmol/L (ref 22–32)
Calcium: 9.2 mg/dL (ref 8.9–10.3)
Chloride: 102 mmol/L (ref 98–111)
Creatinine, Ser: 0.81 mg/dL (ref 0.44–1.00)
GFR, Estimated: >60 mL/min (ref >60)
Glucose, Bld: 111 mg/dL — ABNORMAL HIGH (ref 70–99)
Potassium: 4.2 mmol/L (ref 3.5–5.1)
Sodium: 136 mmol/L (ref 135–145)

## 2021-04-11 LAB — CBC WITH DIFFERENTIAL/PLATELET
Abs Immature Granulocytes: 0.02 10*3/uL (ref 0.00–0.07)
Basophils Absolute: 0 10*3/uL (ref 0.0–0.1)
Basophils Relative: 1 %
Eosinophils Absolute: 0.2 10*3/uL (ref 0.0–0.5)
Eosinophils Relative: 4 %
HCT: 46.3 % — ABNORMAL HIGH (ref 36.0–46.0)
Hemoglobin: 15.5 g/dL — ABNORMAL HIGH (ref 12.0–15.0)
Immature Granulocytes: 0 %
Lymphocytes Relative: 30 %
Lymphs Abs: 1.8 10*3/uL (ref 0.7–4.0)
MCH: 31.8 pg (ref 26.0–34.0)
MCHC: 33.5 g/dL (ref 30.0–36.0)
MCV: 95.1 fL (ref 80.0–100.0)
Monocytes Absolute: 0.4 10*3/uL (ref 0.1–1.0)
Monocytes Relative: 7 %
Neutro Abs: 3.3 10*3/uL (ref 1.7–7.7)
Neutrophils Relative %: 58 %
Platelets: 249 10*3/uL (ref 150–400)
RBC: 4.87 MIL/uL (ref 3.87–5.11)
RDW: 13 % (ref 11.5–15.5)
WBC: 5.8 10*3/uL (ref 4.0–10.5)
nRBC: 0 % (ref 0.0–0.2)

## 2021-04-11 LAB — LIPID PANEL
Cholesterol: 196 mg/dL (ref 0–200)
HDL: 53 mg/dL (ref >40)
LDL Cholesterol: 131 mg/dL — ABNORMAL HIGH (ref 0–99)
Total CHOL/HDL Ratio: 3.7 RATIO
Triglycerides: 58 mg/dL (ref <150)
VLDL: 12 mg/dL (ref 0–40)

## 2021-04-11 LAB — TSH: TSH: 1.199 u[IU]/mL (ref 0.350–4.500)

## 2021-04-12 LAB — HEMOGLOBIN A1C
Hgb A1c MFr Bld: 5.5 % (ref 4.8–5.6)
Mean Plasma Glucose: 111 mg/dL

## 2021-04-23 ENCOUNTER — Other Ambulatory Visit (HOSPITAL_COMMUNITY): Payer: Self-pay | Admitting: Internal Medicine

## 2021-04-23 DIAGNOSIS — Z1231 Encounter for screening mammogram for malignant neoplasm of breast: Secondary | ICD-10-CM

## 2021-05-15 ENCOUNTER — Ambulatory Visit (HOSPITAL_COMMUNITY): Payer: Medicare Other

## 2021-07-18 ENCOUNTER — Encounter (HOSPITAL_COMMUNITY): Payer: Self-pay | Admitting: *Deleted

## 2021-07-18 ENCOUNTER — Emergency Department (HOSPITAL_COMMUNITY)
Admission: EM | Admit: 2021-07-18 | Discharge: 2021-07-18 | Disposition: A | Discharge status: Home / Self Care | Payer: Medicare Other | Attending: Emergency Medicine | Admitting: Emergency Medicine

## 2021-07-18 ENCOUNTER — Emergency Department (HOSPITAL_COMMUNITY): Payer: Medicare Other

## 2021-07-18 ENCOUNTER — Other Ambulatory Visit: Payer: Self-pay

## 2021-07-18 DIAGNOSIS — J45909 Unspecified asthma, uncomplicated: Secondary | ICD-10-CM | POA: Diagnosis not present

## 2021-07-18 DIAGNOSIS — J029 Acute pharyngitis, unspecified: Secondary | ICD-10-CM | POA: Diagnosis present

## 2021-07-18 DIAGNOSIS — R599 Enlarged lymph nodes, unspecified: Secondary | ICD-10-CM | POA: Diagnosis not present

## 2021-07-18 DIAGNOSIS — Z20822 Contact with and (suspected) exposure to covid-19: Secondary | ICD-10-CM | POA: Diagnosis not present

## 2021-07-18 DIAGNOSIS — J209 Acute bronchitis, unspecified: Secondary | ICD-10-CM | POA: Diagnosis not present

## 2021-07-18 DIAGNOSIS — F17210 Nicotine dependence, cigarettes, uncomplicated: Secondary | ICD-10-CM | POA: Insufficient documentation

## 2021-07-18 DIAGNOSIS — J40 Bronchitis, not specified as acute or chronic: Secondary | ICD-10-CM

## 2021-07-18 DIAGNOSIS — J020 Streptococcal pharyngitis: Secondary | ICD-10-CM | POA: Diagnosis not present

## 2021-07-18 DIAGNOSIS — Z79899 Other long term (current) drug therapy: Secondary | ICD-10-CM | POA: Insufficient documentation

## 2021-07-18 LAB — RESP PANEL BY RT-PCR (FLU A&B, COVID) ARPGX2
Influenza A by PCR: NEGATIVE
Influenza B by PCR: NEGATIVE
SARS Coronavirus 2 by RT PCR: NEGATIVE

## 2021-07-18 LAB — GROUP A STREP BY PCR: Group A Strep by PCR: DETECTED — AB

## 2021-07-18 MED ORDER — AMOXICILLIN 500 MG PO CAPS: 500.0000 mg | ORAL_CAPSULE | Freq: Two times a day (BID) | ORAL | 0 refills | Status: DC

## 2021-07-18 MED ORDER — BENZONATATE 100 MG PO CAPS: 100.0000 mg | ORAL_CAPSULE | Freq: Three times a day (TID) | ORAL | 0 refills | Status: DC

## 2021-07-18 MED ORDER — ALBUTEROL SULFATE HFA 108 (90 BASE) MCG/ACT IN AERS: 1.0000 | INHALATION_SPRAY | Freq: Four times a day (QID) | RESPIRATORY_TRACT | 0 refills | Status: AC | PRN

## 2021-07-18 NOTE — ED Provider Notes (Signed)
[facility] EMERGENCY DEPARTMENT Provider Note   CSN: [id number] Arrival date & time: 07/18/21  0941     History Chief Complaint  Patient presents with   Sore Throat    Emily Vasquez is a 40 y.o. female smoker presenting for 1 day of sore throat and 2 days for cough.  Patient has family members with similar symptoms.  Patient denies any chest pain, shortness of breath, nausea, vomiting, fevers, trouble swallowing, rhinorrhea, congestion, or myalgia.  Patient reports that she felt her neck was swollen this morning.  Has been able to eat and drink normally.  Allergic to Compazine.  Daily smoker, but denies any EtOH or drug use.   Sore Throat Pertinent negatives include no chest pain, no abdominal pain, no headaches and no shortness of breath.      Past Medical History:  Diagnosis Date   ADD (attention deficit disorder)    Anxiety    Asthma    Degenerative disc disease, cervical    Fibromyalgia    GERD (gastroesophageal reflux disease)    Low back pain    Ovarian cyst    Ovarian torsion     Patient Active Problem List   Diagnosis Date Noted   S/P hysterectomy 06/17/2017   Complex ovarian cyst 05/28/2017   Enlarged ovary 05/28/2017   Pelvic pain 05/28/2017    Past Surgical History:  Procedure Laterality Date   ABDOMINAL HYSTERECTOMY Left 06/17/2017   Procedure: HYSTERECTOMY ABDOMINAL; LEFT SALPINGO OOPHORECTOMY;  Surgeon: Florian Buff, MD;  Location: [facility];  Service: Gynecology;  Laterality: Left;   CESAREAN SECTION     HAND SURGERY     LAPAROSCOPIC SALPINGO OOPHERECTOMY Left 06/17/2017   Procedure: diagnostic laproscopy;  Surgeon: Florian Buff, MD;  Location: [facility];  Service: Gynecology;  Laterality: Left;  decision to open at [place]       OB History     Gravida  2   Para  2   Term  2   Preterm      AB      Living  2      SAB      IAB      Ectopic      Multiple      Live Births  2           Family  History  Problem Relation Age of Onset   Alcohol abuse Paternal Grandfather    Mental illness Maternal Grandmother    Diabetes Maternal Grandmother    Heart attack Maternal Grandfather    Heart attack Father    High Cholesterol Father    Diabetes Brother    Stroke Brother    Asthma Daughter    Asthma Son     Social History   Tobacco Use   Smoking status: Every Day    Packs/day: 0.75    Years: 26.00    Pack years: 19.50    Types: Cigarettes   Smokeless tobacco: Never  Substance Use Topics   Alcohol use: Yes    Comment: occasional   Drug use: No    Home Medications Prior to Admission medications   Medication Sig Start Date End Date Taking? Authorizing Provider  albuterol (VENTOLIN HFA) 108 (90 Base) MCG/ACT inhaler Inhale 1-2 puffs into the lungs every 6 (six) hours as needed for wheezing or shortness of breath. 07/18/21  Yes Sherrell Puller, PA-C  amoxicillin (AMOXIL) 500 MG capsule Take 1 capsule (500  mg total) by mouth 2 (two) times daily. 07/18/21  Yes Sherrell Puller, PA-C  benzonatate (TESSALON) 100 MG capsule Take 1 capsule (100 mg total) by mouth 3 (three) times daily. 07/18/21  Yes Sherrell Puller, PA-C  ALPRAZolam Duanne Moron) 0.5 MG tablet Take 1 tablet (0.5 mg total) by mouth 3 (three) times daily as needed for anxiety. 07/06/17   Florian Buff, MD  clonazePAM (KLONOPIN) 0.5 MG tablet Take 0.5 mg by mouth 4 (four) times daily. 05/11/17   [provider]  estradiol (ESTRACE) 2 MG tablet Take 1 tablet (2 mg total) by mouth daily. 07/06/17   Florian Buff, MD  estradiol (VIVELLE-DOT) 0.1 MG/24HR patch Place 1 patch (0.1 mg total) onto the skin 2 (two) times a week. Patient not taking: Reported on 07/06/2017 06/25/17   Florian Buff, MD  famotidine (PEPCID) 20 MG tablet Take 1 tablet (20 mg total) by mouth 2 (two) times daily. Patient not taking: Reported on 07/06/2017 07/03/17   Julianne Rice, MD  fluticasone [facility]) 50 MCG/ACT nasal spray Place 2 sprays into both  nostrils daily as needed (for allergies.).     [provider]  gabapentin (NEURONTIN) 300 MG capsule Take 300 mg by mouth 4 (four) times daily as needed (for pain.).     [provider]  gabapentin (NEURONTIN) 600 MG tablet Take 600 mg by mouth 4 (four) times daily as needed (for pain.).     [provider]  hydrOXYzine (ATARAX/VISTARIL) 25 MG tablet Take 1 tablet (25 mg total) by mouth every 6 (six) hours as needed for itching. 07/03/17   Julianne Rice, MD  ketorolac (TORADOL) 10 MG tablet Take 1 tablet (10 mg total) by mouth every 8 (eight) hours as needed. Patient not taking: Reported on 07/06/2017 06/18/17   Florian Buff, MD  levocetirizine (XYZAL) 5 MG tablet Take 1 tablet (5 mg total) by mouth every evening. 07/06/17   Florian Buff, MD  methylphenidate (RITALIN LA) 10 MG 24 hr capsule Take 10 mg by mouth daily.    [provider]  omeprazole (PRILOSEC) 40 MG capsule Take 40 mg by mouth See admin instructions. Take 1 capsule (40 mg) scheduled by mouth every morning, & may take an additional dose if needed for acid reflux/stomach issues    [provider]  ondansetron (ZOFRAN) 8 MG tablet Take 1 tablet (8 mg total) by mouth every 6 (six) hours as needed for nausea. Patient not taking: Reported on 07/06/2017 06/18/17   Florian Buff, MD  oxyCODONE-acetaminophen (PERCOCET/ROXICET) 5-325 MG tablet Take 1 tablet by mouth every 4 (four) hours as needed for severe pain. Patient not taking: Reported on 07/06/2017 06/25/17   Florian Buff, MD  predniSONE (DELTASONE) 20 MG tablet 3 tabs po day one, then 2 po daily x 4 days 07/03/17   Julianne Rice, MD  topiramate (TOPAMAX) 100 MG tablet Take 100 mg by mouth at bedtime. 05/07/17   [provider]  zolpidem (AMBIEN) 10 MG tablet Take 1 tablet (10 mg total) by mouth at bedtime as needed for sleep. Patient not taking: Reported on 07/06/2017 07/03/17 07/18/17  Florian Buff, MD    Allergies    Compazine  [prochlorperazine maleate]  Review of Systems   Review of Systems  Constitutional:  Negative for chills and fever.  HENT:  Positive for sore throat. Negative for congestion, ear pain and rhinorrhea.   Eyes:  Negative for pain and visual disturbance.  Respiratory:  Positive for cough.  Negative for shortness of breath.   Cardiovascular:  Negative for chest pain and palpitations.  Gastrointestinal:  Negative for abdominal pain, constipation, diarrhea, nausea and vomiting.  Genitourinary:  Negative for dysuria and hematuria.  Musculoskeletal:  Negative for arthralgias, back pain and myalgias.  Skin:  Negative for color change and rash.  Neurological:  Negative for seizures, syncope and headaches.  All other systems reviewed and are negative.  Physical Exam Updated Vital Signs BP (!) 137/94 (BP Location: Right Arm)   Pulse (!) 103   Temp 98.8 F (37.1 C) (Oral)   Resp 18   Ht 5\' 11"  (1.803 m)   Wt 113.4 kg   LMP 05/12/2017   SpO2 97%   BMI 34.87 kg/m   Physical Exam Constitutional:      General: She is not in acute distress.    Appearance: Normal appearance. She is not ill-appearing or toxic-appearing.  HENT:     Head: Normocephalic and atraumatic.     Right Ear: Tympanic membrane and ear canal normal. No tenderness. Tympanic membrane is not erythematous.     Left Ear: Tympanic membrane and ear canal normal. No tenderness. Tympanic membrane is not erythematous.     Nose: Congestion present.     Comments: Bilateral nasal turbinate erythema and edema.    Mouth/Throat:     Mouth: Mucous membranes are moist.     Pharynx: Uvula midline. Oropharyngeal exudate and posterior oropharyngeal erythema present.     Tonsils: Tonsillar exudate present. No tonsillar abscesses. 2+ on the right. 2+ on the left.     Comments: Exudate and erythema on bilateral tonsils.  Tonsils 2+.  Airway patent.  Uvula midline. Tolerating secretions. Eyes:     General: No scleral icterus. Cardiovascular:      Rate and Rhythm: Normal rate and regular rhythm.  Pulmonary:     Effort: Pulmonary effort is normal. No respiratory distress.     Breath sounds: Wheezing present.     Comments: Wheezing in bilateral bases.  Patient is in no respiratory distress.  No tripoding, accessory muscle use, or cyanosis present.  Patient speaking in full sentences with ease. Abdominal:     General: Abdomen is flat. Bowel sounds are normal.     Palpations: Abdomen is soft.  Musculoskeletal:        General: No deformity.     Cervical back: Normal range of motion.  Lymphadenopathy:     Cervical: Cervical adenopathy present.  Skin:    General: Skin is warm and dry.  Neurological:     General: No focal deficit present.     Mental Status: She is alert. Mental status is at baseline.    ED Results / Procedures / Treatments   Labs (all labs ordered are listed, but only abnormal results are displayed) Labs Reviewed  GROUP A STREP BY PCR - Abnormal; Notable for the following components:      Result Value   Group A Strep by PCR DETECTED (*)    All other components within normal limits  RESP PANEL BY RT-PCR (FLU A&B, COVID) [id number]    EKG None  Radiology DG Chest 2 View  Result Date: 07/18/2021 CLINICAL DATA:  Cough and sore throat EXAM: CHEST - 2 VIEW COMPARISON:  04/11/2021 FINDINGS: Heart size is normal. Mediastinal shadows are normal. There is mild central bronchial thickening but no consolidation, collapse or effusion. Bony structures unremarkable. IMPRESSION: Possible bronchitis.  No consolidation or collapse. Electronically Signed   By: Jan Fireman.D.  On: 07/18/2021 11:07    Procedures Procedures   Medications Ordered in ED Medications - No data to display  ED Course  I have reviewed the triage vital signs and the nursing notes.  Pertinent labs & imaging results that were available during my care of the patient were reviewed by me and considered in my medical decision making (see chart for  details).  Jersee Winiarski is being seen for cough and sore throat x2 days.  Differential diagnosis includes but not limited to pneumonia, pneumothorax, bronchitis, strep throat, PTA, COVID, flu.  I personally reviewed and interpreted the patient's labs and imaging.  COVID and flu negative.  Strep positive.  Chest x-ray showed possible signs of bronchitis, but no active cardiopulmonary process.  No pneumonia or pneumothorax.  Will prescribe patient amoxicillin twice daily for 10 days as well as Tessalon Perles.  Additionally, I gave the patient a refill on her albuterol inhaler.  Recommended Tylenol and/or ibuprofen for pain.  Return precautions discussed with patient.  She is agreeable to plan.  Patient stable and being discharged home in good condition.   MDM Rules/Calculators/A&P                          Final Clinical Impression(s) / ED Diagnoses Final diagnoses:  Strep throat  Bronchitis    Rx / DC Orders ED Discharge Orders          Ordered    benzonatate (TESSALON) 100 MG capsule  3 times daily        07/18/21 1217    amoxicillin (AMOXIL) 500 MG capsule  2 times daily        07/18/21 1217    albuterol (VENTOLIN HFA) 108 (90 Base) MCG/ACT inhaler  Every 6 hours PRN        07/18/21 1218             Sherrell Puller, PA-C 07/18/21 1341    Carmin Muskrat, MD 07/19/21 1401

## 2021-07-18 NOTE — ED Triage Notes (Addendum)
Pt c/o sore throat, decreased appetite, body aches, cough, chills that started yesterday. Pt reports she feels like her glands in her neck are starting to swell today.

## 2021-07-18 NOTE — Discharge Instructions (Addendum)
You were seen here today for strep throat and bronchitis.  You been prescribed amoxicillin as your antibiotic.  Please take as directed and finish the entire course.  Additionally, you have been prescribed a medication called Tessalon Perles that will help you stop coughing.  Also, I prescribed you a refill for your albuterol inhaler if needed. You can take Tylenol or Ibuprofen as needed for pain. Please try not to share drinks or food with anyone as this is contagious.  If you have any worsening symptoms as previously discussed such as drooling, trouble swallowing, increased pain, shortness of breath, please present to the nearest emergency department.

## 2021-07-18 NOTE — ED Provider Notes (Signed)
Emergency Medicine Provider Triage Evaluation Note  Emily Vasquez , a 40 y.o. female smoker with h/o asthma was evaluated in triage.  Pt complains of sore throat and body aches starting today. Cough starting yesterday. Pain with swallowing. Family member sick with similar symptoms. Chills, but never recorded a temperature.   Review of Systems  Positive: Sore throat, body aches, ear pain, rhinorrhea Negative: Chest pain, SOB,   Physical Exam  LMP 05/12/2017  Gen:   Awake, no distress   Resp:  Normal effort, exipiratory wheezing heard on bilateral lower  lung fields MSK:   Moves extremities without difficulty  Other:  Tonsils 2+, airway patent, uvula midline   Medical Decision Making  Medically screening exam initiated at 10:21 AM.  Appropriate orders placed.  Sayla Golonka was informed that the remainder of the evaluation will be completed by another provider, this initial triage assessment does not replace that evaluation, and the importance of remaining in the ED until their evaluation is complete.  Labs and imaging ordered   Sherrell Puller, Hershal Coria 07/18/21 [place]    Carmin Muskrat, MD 07/19/21 1401

## 2022-02-05 IMAGING — DX DG CHEST 2V
2 series · 2 of 2 positions shown · non-contrast
Comparison: 04/11/2021

CLINICAL DATA: Cough and sore throat

EXAM:
CHEST - 2 VIEW

[chest pa]
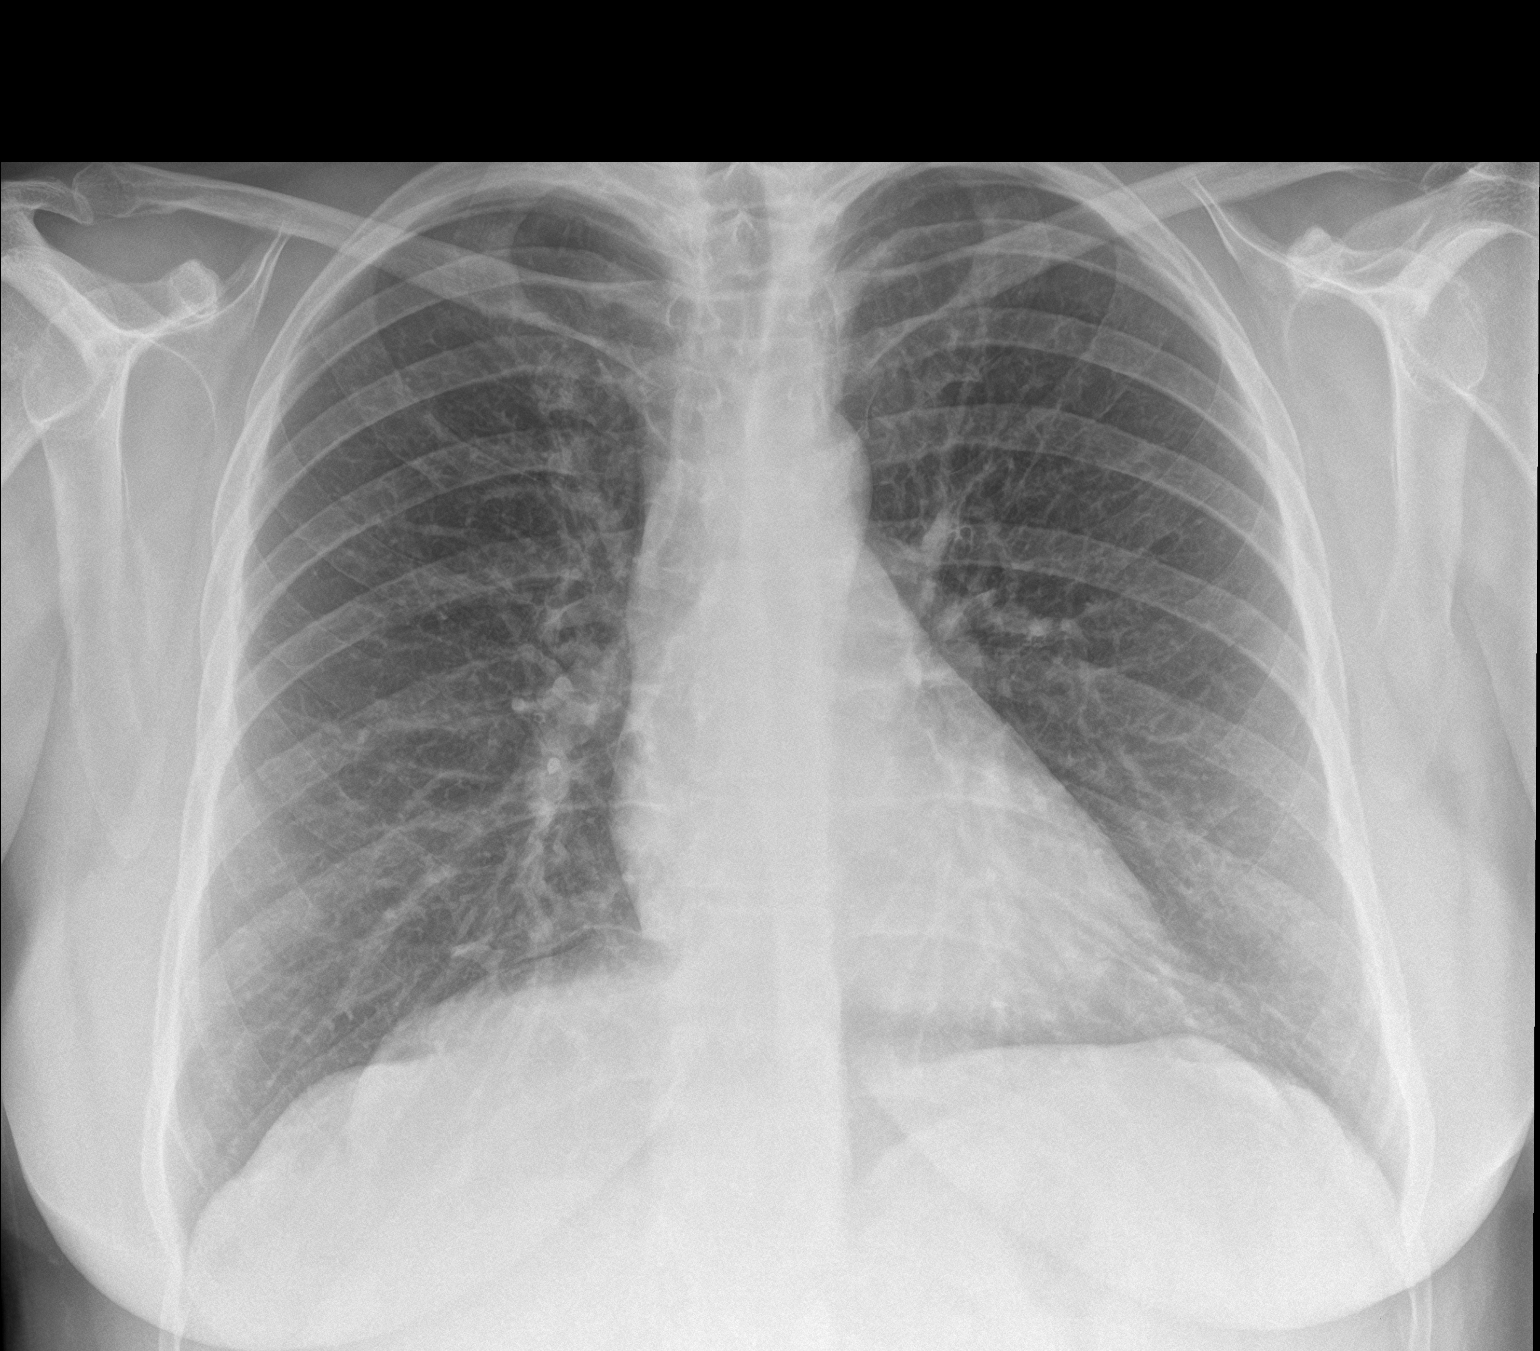

[chest lat]
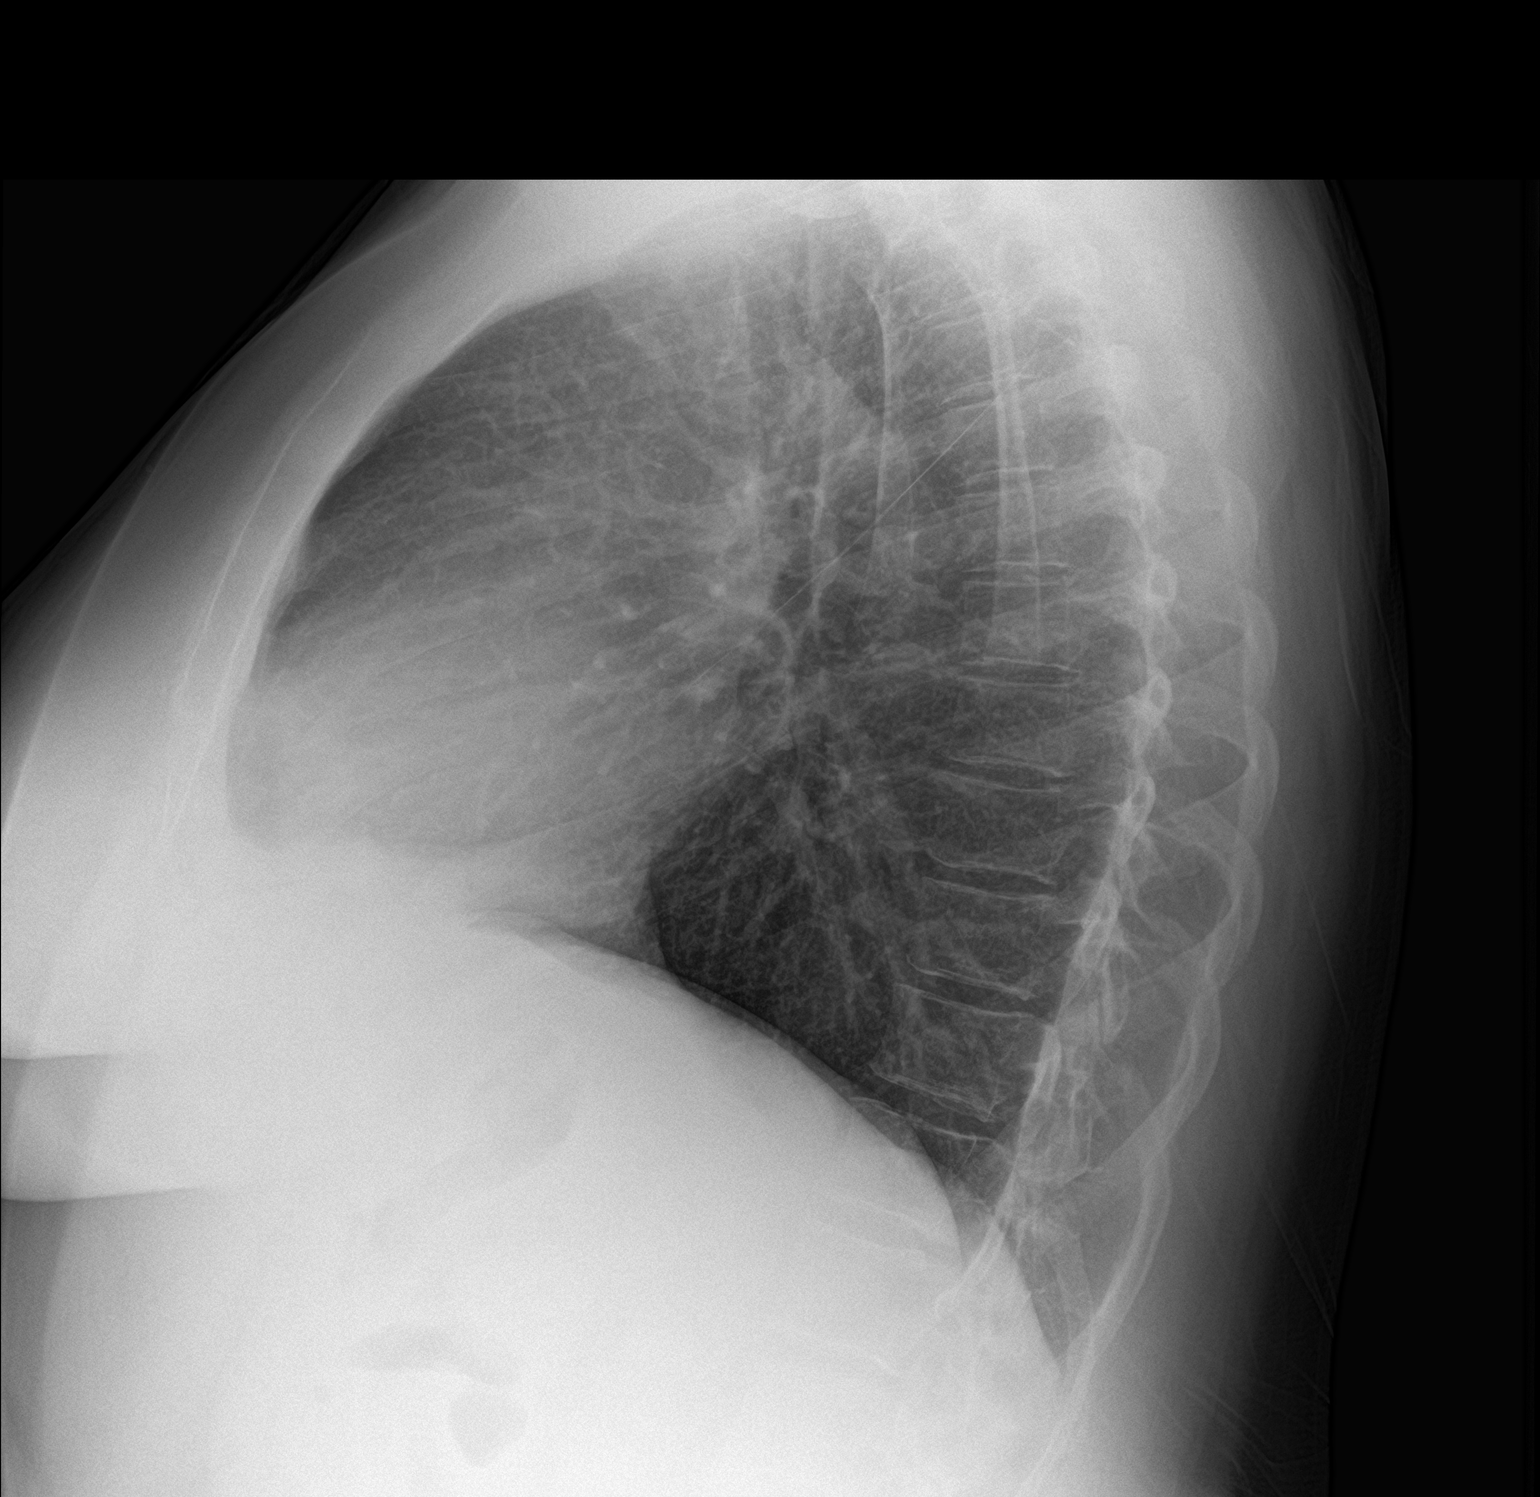

[2 of 2 positions shown; findings below may reference images not displayed]

FINDINGS: Heart size is normal. Mediastinal shadows are normal. There is mild
central bronchial thickening but no consolidation, collapse or
effusion. Bony structures unremarkable.
IMPRESSION: Possible bronchitis.  No consolidation or collapse.

## 2022-02-07 ENCOUNTER — Emergency Department (HOSPITAL_COMMUNITY): Payer: Medicare Other

## 2022-02-07 ENCOUNTER — Emergency Department (HOSPITAL_COMMUNITY)
Admission: EM | Admit: 2022-02-07 | Discharge: 2022-02-07 | Disposition: A | Discharge status: Home / Self Care | Payer: Medicare Other | Attending: Emergency Medicine | Admitting: Emergency Medicine

## 2022-02-07 ENCOUNTER — Encounter (HOSPITAL_COMMUNITY): Payer: Self-pay | Admitting: *Deleted

## 2022-02-07 ENCOUNTER — Other Ambulatory Visit: Payer: Self-pay

## 2022-02-07 DIAGNOSIS — Z7952 Long term (current) use of systemic steroids: Secondary | ICD-10-CM | POA: Diagnosis not present

## 2022-02-07 DIAGNOSIS — R079 Chest pain, unspecified: Secondary | ICD-10-CM | POA: Diagnosis present

## 2022-02-07 DIAGNOSIS — J4520 Mild intermittent asthma, uncomplicated: Secondary | ICD-10-CM | POA: Insufficient documentation

## 2022-02-07 DIAGNOSIS — J069 Acute upper respiratory infection, unspecified: Secondary | ICD-10-CM

## 2022-02-07 DIAGNOSIS — F17210 Nicotine dependence, cigarettes, uncomplicated: Secondary | ICD-10-CM | POA: Diagnosis not present

## 2022-02-07 DIAGNOSIS — Z7951 Long term (current) use of inhaled steroids: Secondary | ICD-10-CM | POA: Diagnosis not present

## 2022-02-07 LAB — BASIC METABOLIC PANEL
Anion gap: 8 (ref 5–15)
BUN: 15 mg/dL (ref 6–20)
CO2: 24 mmol/L (ref 22–32)
Calcium: 8.7 mg/dL — ABNORMAL LOW (ref 8.9–10.3)
Chloride: 103 mmol/L (ref 98–111)
Creatinine, Ser: 0.9 mg/dL (ref 0.44–1.00)
GFR, Estimated: >60 mL/min (ref >60)
Glucose, Bld: 111 mg/dL — ABNORMAL HIGH (ref 70–99)
Potassium: 3.5 mmol/L (ref 3.5–5.1)
Sodium: 135 mmol/L (ref 135–145)

## 2022-02-07 LAB — CBC
HCT: 42 % (ref 36.0–46.0)
Hemoglobin: 14.7 g/dL (ref 12.0–15.0)
MCH: 30.9 pg (ref 26.0–34.0)
MCHC: 35 g/dL (ref 30.0–36.0)
MCV: 88.4 fL (ref 80.0–100.0)
Platelets: 173 10*3/uL (ref 150–400)
RBC: 4.75 MIL/uL (ref 3.87–5.11)
RDW: 12.9 % (ref 11.5–15.5)
WBC: 6.2 10*3/uL (ref 4.0–10.5)
nRBC: 0 % (ref 0.0–0.2)

## 2022-02-07 LAB — GROUP A STREP BY PCR: Group A Strep by PCR: NOT DETECTED

## 2022-02-07 LAB — TROPONIN I (HIGH SENSITIVITY): Troponin I (High Sensitivity): <2 ng/L (ref <18)

## 2022-02-07 MED ORDER — IPRATROPIUM-ALBUTEROL 0.5-2.5 (3) MG/3ML IN SOLN
3.0000 mL | Freq: Once | RESPIRATORY_TRACT | Status: AC
  Administered 2022-02-07: 3 mL via RESPIRATORY_TRACT
  Filled 2022-02-07: qty 3

## 2022-02-07 MED ORDER — BENZONATATE 100 MG PO CAPS
100.0000 mg | ORAL_CAPSULE | Freq: Once | ORAL | Status: AC
  Administered 2022-02-07: 100 mg via ORAL
  Filled 2022-02-07: qty 1

## 2022-02-07 MED ORDER — METHYLPREDNISOLONE SODIUM SUCC 125 MG IJ SOLR
125.0000 mg | Freq: Once | INTRAMUSCULAR | Status: AC
  Administered 2022-02-07: 125 mg via INTRAVENOUS
  Filled 2022-02-07: qty 2

## 2022-02-07 MED ORDER — BENZONATATE 100 MG PO CAPS: 100.0000 mg | ORAL_CAPSULE | Freq: Three times a day (TID) | ORAL | 0 refills | Status: DC

## 2022-02-07 MED ORDER — PREDNISONE 10 MG PO TABS: 20.0000 mg | ORAL_TABLET | Freq: Every day | ORAL | 0 refills | Status: DC

## 2022-02-07 NOTE — ED Provider Notes (Signed)
?[facility] ?Provider Note ? ? ?CSN: [id number] ?Arrival date & time: 02/07/22  1350 ? ?  ? ?History ? ?Chief Complaint  ?Patient presents with  ? Chest Pain  ? ? ?Emily Vasquez is a 41 y.o. female. ? ? ?Chest Pain ? ?Patient with medical history notable for tobacco dependence, anxiety, asthma, fibromyalgia, GERD presents today due to chest pain.  Chest pain started 2 days ago, its constant.  It feels like a pressure and radiates all over her chest.  Nothing makes it better, coughing makes it worse.  She reports has been having a nonproductive cough for the last 2 days, also feels febrile and has a sore throat.  Family member at home sick with strep.  She is been taking their leftover antibiotics which was Levaquin per the patient.  States this has not improved her symptoms.  She denies any history of ACS, no history of PE.  Not on anticoagulations, typically is a daily cigarette smoker but has not smoked for the last 2 days. ? ?Home Medications ?Prior to Admission medications   ?Medication Sig Start Date End Date Taking? Authorizing Provider  ?benzonatate (TESSALON) 100 MG capsule Take 1 capsule (100 mg total) by mouth every 8 (eight) hours. 02/07/22  Yes Sherrill Raring, PA-C  ?predniSONE (DELTASONE) 10 MG tablet Take 2 tablets (20 mg total) by mouth daily. 02/07/22  Yes Sherrill Raring, PA-C  ?albuterol (VENTOLIN HFA) 108 (90 Base) MCG/ACT inhaler Inhale 1-2 puffs into the lungs every 6 (six) hours as needed for wheezing or shortness of breath. 07/18/21   Sherrell Puller, PA-C  ?ALPRAZolam (XANAX) 0.5 MG tablet Take 1 tablet (0.5 mg total) by mouth 3 (three) times daily as needed for anxiety. 07/06/17   Florian Buff, MD  ?amoxicillin (AMOXIL) 500 MG capsule Take 1 capsule (500 mg total) by mouth 2 (two) times daily. 07/18/21   Sherrell Puller, PA-C  ?clonazePAM (KLONOPIN) 0.5 MG tablet Take 0.5 mg by mouth 4 (four) times daily. 05/11/17   [provider]  ?estradiol (ESTRACE) 2 MG tablet Take 1  tablet (2 mg total) by mouth daily. 07/06/17   Florian Buff, MD  ?estradiol (VIVELLE-DOT) 0.1 MG/24HR patch Place 1 patch (0.1 mg total) onto the skin 2 (two) times a week. ?Patient not taking: Reported on 07/06/2017 06/25/17   Florian Buff, MD  ?famotidine (PEPCID) 20 MG tablet Take 1 tablet (20 mg total) by mouth 2 (two) times daily. ?Patient not taking: Reported on 07/06/2017 07/03/17   Julianne Rice, MD  ?fluticasone [facility]) 50 MCG/ACT nasal spray Place 2 sprays into both nostrils daily as needed (for allergies.).     [provider]  ?gabapentin (NEURONTIN) 300 MG capsule Take 300 mg by mouth 4 (four) times daily as needed (for pain.).     [provider]  ?gabapentin (NEURONTIN) 600 MG tablet Take 600 mg by mouth 4 (four) times daily as needed (for pain.).     [provider]  ?hydrOXYzine (ATARAX/VISTARIL) 25 MG tablet Take 1 tablet (25 mg total) by mouth every 6 (six) hours as needed for itching. 07/03/17   Julianne Rice, MD  ?ketorolac (TORADOL) 10 MG tablet Take 1 tablet (10 mg total) by mouth every 8 (eight) hours as needed. ?Patient not taking: Reported on 07/06/2017 06/18/17   Florian Buff, MD  ?levocetirizine (XYZAL) 5 MG tablet Take 1 tablet (5 mg total) by mouth every evening. 07/06/17   Florian Buff, MD  ?methylphenidate (RITALIN LA) 10  MG 24 hr capsule Take 10 mg by mouth daily.    [provider]  ?omeprazole (PRILOSEC) 40 MG capsule Take 40 mg by mouth See admin instructions. Take 1 capsule (40 mg) scheduled by mouth every morning, & may take an additional dose if needed for acid reflux/stomach issues    [provider]  ?ondansetron (ZOFRAN) 8 MG tablet Take 1 tablet (8 mg total) by mouth every 6 (six) hours as needed for nausea. ?Patient not taking: Reported on 07/06/2017 06/18/17   Florian Buff, MD  ?oxyCODONE-acetaminophen (PERCOCET/ROXICET) 5-325 MG tablet Take 1 tablet by mouth every 4 (four) hours as needed for severe pain. ?Patient not  taking: Reported on 07/06/2017 06/25/17   Florian Buff, MD  ?topiramate (TOPAMAX) 100 MG tablet Take 100 mg by mouth at bedtime. 05/07/17   [provider]  ?zolpidem (AMBIEN) 10 MG tablet Take 1 tablet (10 mg total) by mouth at bedtime as needed for sleep. ?Patient not taking: Reported on 07/06/2017 07/03/17 07/18/17  Florian Buff, MD  ?   ? ?Allergies    ?Compazine [prochlorperazine maleate]   ? ?Review of Systems   ?Review of Systems  ?Cardiovascular:  Positive for chest pain.  ? ?Physical Exam ?Updated Vital Signs ?BP 113/90 (BP Location: Left Arm)   Pulse 88   Temp 98.3 ?F (36.8 ?C) (Oral)   Resp (!) 23   Ht '5\' 11"'$  (1.803 m)   Wt 113.4 kg   LMP 05/12/2017   SpO2 93%   BMI 34.87 kg/m?  ?Physical Exam ?Vitals and nursing note reviewed. Exam conducted with a chaperone present.  ?Constitutional:   ?   Appearance: Normal appearance.  ?HENT:  ?   Head: Normocephalic and atraumatic.  ?   Mouth/Throat:  ?   Pharynx: Posterior oropharyngeal erythema present.  ?Eyes:  ?   General: No scleral icterus.    ?   Right eye: No discharge.     ?   Left eye: No discharge.  ?   Extraocular Movements: Extraocular movements intact.  ?   Pupils: Pupils are equal, round, and reactive to light.  ?Cardiovascular:  ?   Rate and Rhythm: Normal rate and regular rhythm.  ?   Pulses: Normal pulses.  ?   Heart sounds: Normal heart sounds. No murmur heard. ?  No friction rub. No gallop.  ?Pulmonary:  ?   Effort: Pulmonary effort is normal. Tachypnea present. No respiratory distress.  ?   Breath sounds: Examination of the right-middle field reveals wheezing. Examination of the left-middle field reveals wheezing. Examination of the right-lower field reveals wheezing. Examination of the left-lower field reveals wheezing. Wheezing present.  ?   Comments: Speaking complete sentences, slight tachypnea but no accessory muscle use.  Diffuse expiratory wheezing through the lung fields. ?Abdominal:  ?   General: Abdomen is flat. Bowel  sounds are normal. There is no distension.  ?   Palpations: Abdomen is soft.  ?   Tenderness: There is no abdominal tenderness.  ?Skin: ?   General: Skin is warm and dry.  ?   Coloration: Skin is not jaundiced.  ?Neurological:  ?   Mental Status: She is alert. Mental status is at baseline.  ?   Coordination: Coordination normal.  ? ? ?ED Results / Procedures / Treatments   ?Labs ?(all labs ordered are listed, but only abnormal results are displayed) ?Labs Reviewed  ?BASIC METABOLIC PANEL - Abnormal; Notable for the following components:  ?  Result Value  ? Glucose, Bld 111 (*)   ? Calcium 8.7 (*)   ? All other components within normal limits  ?GROUP A STREP BY PCR  ?CBC  ?TROPONIN I (HIGH SENSITIVITY)  ? ? ?EKG ?EKG Interpretation ? ?Date/Time:  Friday February 07 2022 14:32:20 EDT ?Ventricular Rate:  85 ?PR Interval:  147 ?QRS Duration: 97 ?QT Interval:  356 ?QTC Calculation: 424 ?R Axis:   48 ?Text Interpretation: Sinus rhythm Consider left atrial enlargement Confirmed by Isla Pence [phone]) on 02/07/2022 2:41:27 PM ? ?Radiology ?DG Chest Port 1 View ? ?Result Date: 02/07/2022 ?CLINICAL DATA:  Chest pain and shortness of breath for 4 days. EXAM: PORTABLE CHEST 1 VIEW COMPARISON:  July 18, 2021. FINDINGS: EKG leads project over the chest. Cardiomediastinal contours and hilar structures are normal. Lungs are clear. No sign of effusion on frontal radiograph. On limited assessment there is no acute skeletal process. IMPRESSION: No acute cardiopulmonary disease. Electronically Signed   By: Zetta Bills M.D.   On: 02/07/2022 14:36   ? ?Procedures ?Procedures  ? ? ?Medications Ordered in ED ?Medications  ?ipratropium-albuterol (DUONEB) 0.5-2.5 (3) MG/3ML nebulizer solution 3 mL (3 mLs Nebulization Given 02/07/22 1443)  ?benzonatate (TESSALON) capsule 100 mg (100 mg Oral Given 02/07/22 1442)  ?methylPREDNISolone sodium succinate (SOLU-MEDROL) 125 mg/2 mL injection 125 mg (125 mg Intravenous Given 02/07/22 1443)   ?ipratropium-albuterol (DUONEB) 0.5-2.5 (3) MG/3ML nebulizer solution 3 mL (3 mLs Nebulization Given 02/07/22 1526)  ? ? ?ED Course/ Medical Decision Making/ A&P ?  ?                        ?Medical Decision Making ?Amount an

## 2022-02-07 NOTE — ED Triage Notes (Signed)
Pt with chills and chest heaviness , dry cough, sore throat-son with recent strep.  Pt states she is taking some leftover antibiotics. ?

## 2022-02-07 NOTE — Discharge Instructions (Addendum)
I suspect you have a virus that has been exacerbating underlying asthma.  Continue using your albuterol inhaler as needed for shortness of breath or wheezing.  Take the [facility] every 8 hours as needed for cough.  Take 40 mg of prednisone daily for 5 days starting tomorrow.  Follow-up with your primary next week for reevaluation.  Return to the ED if you start having new symptoms, worsening pain ?

## 2022-08-28 IMAGING — DX DG CHEST 1V PORT
1 series · 1 of 1 positions shown · non-contrast
Comparison: July 18, 2021.

CLINICAL DATA: Chest pain and shortness of breath for 4 days.

EXAM:
PORTABLE CHEST 1 VIEW

[chest ap]
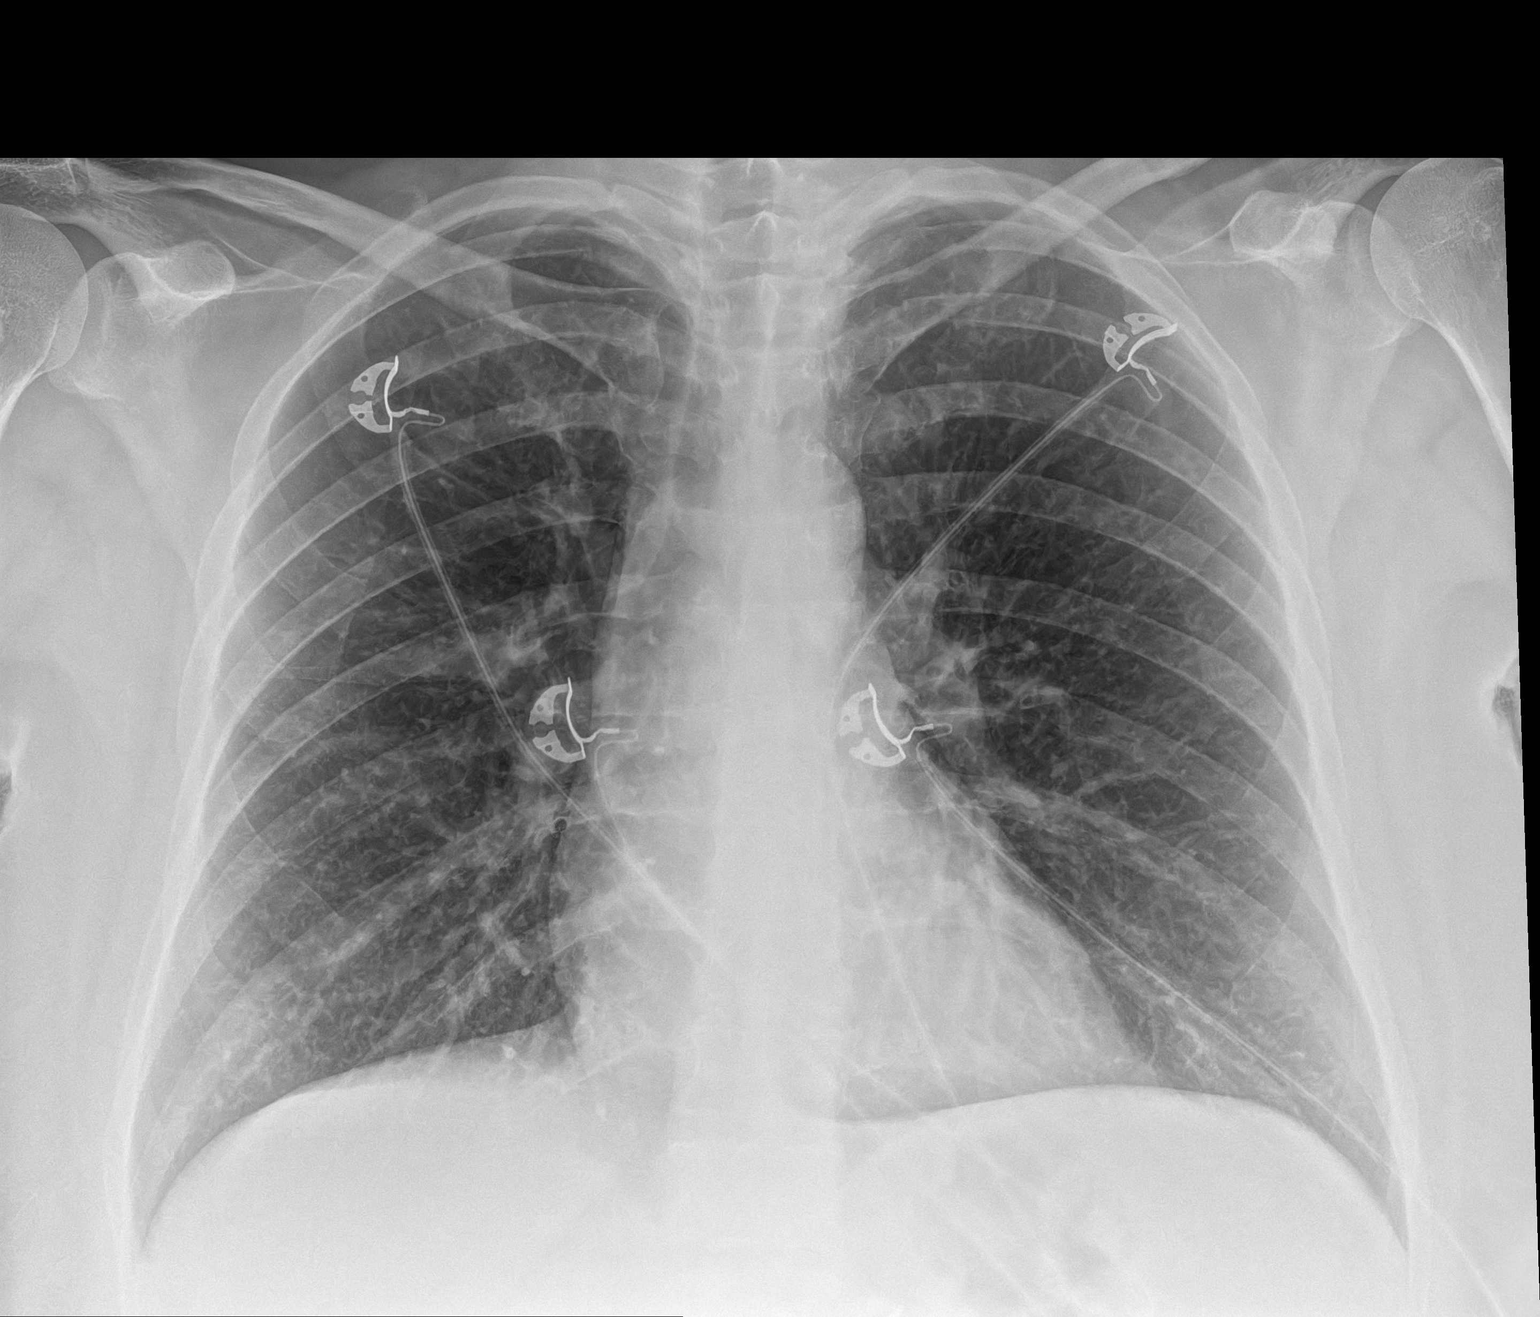

[1 of 1 positions shown; findings below may reference images not displayed]

FINDINGS: EKG leads project over the chest.

Cardiomediastinal contours and hilar structures are normal. Lungs
are clear. No sign of effusion on frontal radiograph.

On limited assessment there is no acute skeletal process.
IMPRESSION: No acute cardiopulmonary disease.

## 2022-12-29 ENCOUNTER — Other Ambulatory Visit (HOSPITAL_COMMUNITY): Payer: Self-pay | Admitting: Internal Medicine

## 2022-12-29 DIAGNOSIS — Z1231 Encounter for screening mammogram for malignant neoplasm of breast: Secondary | ICD-10-CM

## 2023-01-05 ENCOUNTER — Ambulatory Visit (HOSPITAL_COMMUNITY): Payer: Medicare Other

## 2023-01-07 ENCOUNTER — Ambulatory Visit (HOSPITAL_COMMUNITY)
Admission: RE | Admit: 2023-01-07 | Discharge: 2023-01-07 | Disposition: A | Discharge status: Home / Self Care | Payer: 59 | Source: Ambulatory Visit | Attending: Internal Medicine | Admitting: Internal Medicine

## 2023-01-07 DIAGNOSIS — Z1231 Encounter for screening mammogram for malignant neoplasm of breast: Secondary | ICD-10-CM | POA: Diagnosis not present

## 2023-01-12 ENCOUNTER — Other Ambulatory Visit (HOSPITAL_COMMUNITY): Payer: Self-pay | Admitting: Internal Medicine

## 2023-01-12 DIAGNOSIS — R928 Other abnormal and inconclusive findings on diagnostic imaging of breast: Secondary | ICD-10-CM

## 2023-01-13 ENCOUNTER — Ambulatory Visit (HOSPITAL_COMMUNITY)
Admission: RE | Admit: 2023-01-13 | Discharge: 2023-01-13 | Disposition: A | Discharge status: Home / Self Care | Payer: 59 | Source: Ambulatory Visit | Attending: Internal Medicine | Admitting: Internal Medicine

## 2023-01-13 DIAGNOSIS — R928 Other abnormal and inconclusive findings on diagnostic imaging of breast: Secondary | ICD-10-CM

## 2023-01-15 ENCOUNTER — Encounter: Payer: Self-pay | Admitting: Internal Medicine

## 2023-02-06 NOTE — Progress Notes (Deleted)
GI Office Note    Referring Provider: Benetta Spar* Primary Care Physician:  Benetta Spar, MD  Primary Gastroenterologist:  Chief Complaint   No chief complaint on file.    History of Present Illness   Emily Vasquez is a 42 y.o. female presenting today at the request of Dr. Felecia Shelling for further evaluation of abnormal LFTs.  Labs***from referral  ADHD ETOH  COPD      Medications   Current Outpatient Medications  Medication Sig Dispense Refill   albuterol (VENTOLIN HFA) 108 (90 Base) MCG/ACT inhaler Inhale 1-2 puffs into the lungs every 6 (six) hours as needed for wheezing or shortness of breath. 1 each 0   ALPRAZolam (XANAX) 0.5 MG tablet Take 1 tablet (0.5 mg total) by mouth 3 (three) times daily as needed for anxiety. 60 tablet 1   amoxicillin (AMOXIL) 500 MG capsule Take 1 capsule (500 mg total) by mouth 2 (two) times daily. 20 capsule 0   benzonatate (TESSALON) 100 MG capsule Take 1 capsule (100 mg total) by mouth every 8 (eight) hours. 21 capsule 0   clonazePAM (KLONOPIN) 0.5 MG tablet Take 0.5 mg by mouth 4 (four) times daily.  1   estradiol (ESTRACE) 2 MG tablet Take 1 tablet (2 mg total) by mouth daily. 30 tablet 11   estradiol (VIVELLE-DOT) 0.1 MG/24HR patch Place 1 patch (0.1 mg total) onto the skin 2 (two) times a week. (Patient not taking: Reported on 07/06/2017) 8 patch 12   famotidine (PEPCID) 20 MG tablet Take 1 tablet (20 mg total) by mouth 2 (two) times daily. (Patient not taking: Reported on 07/06/2017) 10 tablet 0   fluticasone (FLONASE) 50 MCG/ACT nasal spray Place 2 sprays into both nostrils daily as needed (for allergies.).      gabapentin (NEURONTIN) 300 MG capsule Take 300 mg by mouth 4 (four) times daily as needed (for pain.).      gabapentin (NEURONTIN) 600 MG tablet Take 600 mg by mouth 4 (four) times daily as needed (for pain.).      hydrOXYzine (ATARAX/VISTARIL) 25 MG tablet Take 1 tablet (25 mg total) by mouth every 6  (six) hours as needed for itching. 20 tablet 0   ketorolac (TORADOL) 10 MG tablet Take 1 tablet (10 mg total) by mouth every 8 (eight) hours as needed. (Patient not taking: Reported on 07/06/2017) 15 tablet 0   levocetirizine (XYZAL) 5 MG tablet Take 1 tablet (5 mg total) by mouth every evening. 30 tablet 1   methylphenidate (RITALIN LA) 10 MG 24 hr capsule Take 10 mg by mouth daily.     omeprazole (PRILOSEC) 40 MG capsule Take 40 mg by mouth See admin instructions. Take 1 capsule (40 mg) scheduled by mouth every morning, & may take an additional dose if needed for acid reflux/stomach issues     ondansetron (ZOFRAN) 8 MG tablet Take 1 tablet (8 mg total) by mouth every 6 (six) hours as needed for nausea. (Patient not taking: Reported on 07/06/2017) 20 tablet 0   oxyCODONE-acetaminophen (PERCOCET/ROXICET) 5-325 MG tablet Take 1 tablet by mouth every 4 (four) hours as needed for severe pain. (Patient not taking: Reported on 07/06/2017) 20 tablet 0   predniSONE (DELTASONE) 10 MG tablet Take 2 tablets (20 mg total) by mouth daily. 20 tablet 0   topiramate (TOPAMAX) 100 MG tablet Take 100 mg by mouth at bedtime.  1   zolpidem (AMBIEN) 10 MG tablet Take 1 tablet (10 mg total) by mouth at  bedtime as needed for sleep. (Patient not taking: Reported on 07/06/2017) 15 tablet 3   No current facility-administered medications for this visit.    Allergies   Allergies as of 02/09/2023 - Review Complete 02/07/2022  Allergen Reaction Noted   Compazine [prochlorperazine maleate] Other (See Comments) 05/24/2017    Past Medical History   Past Medical History:  Diagnosis Date   ADD (attention deficit disorder)    Anxiety    Asthma    Degenerative disc disease, cervical    Fibromyalgia    GERD (gastroesophageal reflux disease)    Low back pain    Ovarian cyst    Ovarian torsion     Past Surgical History   Past Surgical History:  Procedure Laterality Date   ABDOMINAL HYSTERECTOMY Left 06/17/2017    Procedure: HYSTERECTOMY ABDOMINAL; LEFT SALPINGO OOPHORECTOMY;  Surgeon: Lazaro Arms, MD;  Location: [facility];  Service: Gynecology;  Laterality: Left;   CESAREAN SECTION     HAND SURGERY     LAPAROSCOPIC SALPINGO OOPHERECTOMY Left 06/17/2017   Procedure: diagnostic laproscopy;  Surgeon: Lazaro Arms, MD;  Location: [facility];  Service: Gynecology;  Laterality: Left;  decision to open at 1330   OOPHORECTOMY     TUBAL LIGATION      Past Family History   Family History  Problem Relation Age of Onset   Alcohol abuse Paternal Grandfather    Mental illness Maternal Grandmother    Diabetes Maternal Grandmother    Heart attack Maternal Grandfather    Heart attack Father    High Cholesterol Father    Diabetes Brother    Stroke Brother    Asthma Daughter    Asthma Son     Past Social History   Social History   Socioeconomic History   Marital status: Single    Spouse name: Not on file   Number of children: Not on file   Years of education: Not on file   Highest education level: Not on file  Occupational History   Not on file  Tobacco Use   Smoking status: Every Day    Packs/day: 0.75    Years: 26.00    Additional pack years: 0.00    Total pack years: 19.50    Types: Cigarettes   Smokeless tobacco: Never  Substance and Sexual Activity   Alcohol use: Yes    Comment: occasional   Drug use: No   Sexual activity: Not Currently    Birth control/protection: Surgical    Comment: tubal  Other Topics Concern   Not on file  Social History Narrative   Not on file   Social Determinants of Health   Financial Resource Strain: Not on file  Food Insecurity: Not on file  Transportation Needs: Not on file  Physical Activity: Not on file  Stress: Not on file  Social Connections: Not on file  Intimate Partner Violence: Not on file    Review of Systems   General: Negative for anorexia, weight loss, fever, chills, fatigue, weakness. Eyes: Negative for vision changes.  ENT:  Negative for hoarseness, difficulty swallowing , nasal congestion. CV: Negative for chest pain, angina, palpitations, dyspnea on exertion, peripheral edema.  Respiratory: Negative for dyspnea at rest, dyspnea on exertion, cough, sputum, wheezing.  GI: See history of present illness. GU:  Negative for dysuria, hematuria, urinary incontinence, urinary frequency, nocturnal urination.  MS: Negative for joint pain, low back pain.  Derm: Negative for rash or itching.  Neuro: Negative for weakness, abnormal sensation, seizure,  frequent headaches, memory loss,  confusion.  Psych: Negative for anxiety, depression, suicidal ideation, hallucinations.  Endo: Negative for unusual weight change.  Heme: Negative for bruising or bleeding. Allergy: Negative for rash or hives.  Physical Exam   LMP 05/12/2017    General: Well-nourished, well-developed in no acute distress.  Head: Normocephalic, atraumatic.   Eyes: Conjunctiva pink, no icterus. Mouth: Oropharyngeal mucosa moist and pink , no lesions erythema or exudate. Neck: Supple without thyromegaly, masses, or lymphadenopathy.  Lungs: Clear to auscultation bilaterally.  Heart: Regular rate and rhythm, no murmurs rubs or gallops.  Abdomen: Bowel sounds are normal, nontender, nondistended, no hepatosplenomegaly or masses,  no abdominal bruits or hernia, no rebound or guarding.   Rectal: *** Extremities: No lower extremity edema. No clubbing or deformities.  Neuro: Alert and oriented x 4 , grossly normal neurologically.  Skin: Warm and dry, no rash or jaundice.   Psych: Alert and cooperative, normal mood and affect.  Labs   *** Imaging Studies   MM 3D DIAGNOSTIC MAMMOGRAM UNILATERAL RIGHT BREAST  Result Date: 01/13/2023 CLINICAL DATA:  Screening recall from baseline mammography for possible right breast asymmetry. EXAM: DIGITAL DIAGNOSTIC UNILATERAL RIGHT MAMMOGRAM WITH TOMOSYNTHESIS TECHNIQUE: Right digital diagnostic mammography and breast  tomosynthesis was performed. COMPARISON:  Previous exam(s). ACR Breast Density Category b: There are scattered areas of fibroglandular density. FINDINGS: Additional tomograms were performed of the right breast. The initially questioned possible right breast asymmetry resolves on the additional imaging with findings compatible with an area of overlapping fibroglandular tissue. There is no mammographic evidence of malignancy in the right breast. IMPRESSION: No mammographic evidence of malignancy in the right breast. RECOMMENDATION: Screening mammogram in one year.(Code:SM-B-01Y) I have discussed the findings and recommendations with the patient. If applicable, a reminder letter will be sent to the patient regarding the next appointment. BI-RADS CATEGORY  1: Negative. Electronically Signed   By: Edwin Cap M.D.   On: 01/13/2023 10:29    Assessment       PLAN   ***   Leanna Battles. Melvyn Neth, MHS, PA-C [facility]

## 2023-02-09 ENCOUNTER — Ambulatory Visit: Payer: 59 | Admitting: Gastroenterology

## 2023-02-12 ENCOUNTER — Encounter (HOSPITAL_COMMUNITY): Payer: Self-pay | Admitting: *Deleted

## 2023-02-12 ENCOUNTER — Emergency Department (HOSPITAL_COMMUNITY)
Admission: EM | Admit: 2023-02-12 | Discharge: 2023-02-12 | Disposition: A | Discharge status: Home / Self Care | Payer: 59 | Attending: Student | Admitting: Student

## 2023-02-12 ENCOUNTER — Emergency Department (HOSPITAL_COMMUNITY): Payer: 59

## 2023-02-12 ENCOUNTER — Other Ambulatory Visit: Payer: Self-pay

## 2023-02-12 DIAGNOSIS — Z1152 Encounter for screening for COVID-19: Secondary | ICD-10-CM | POA: Insufficient documentation

## 2023-02-12 DIAGNOSIS — K029 Dental caries, unspecified: Secondary | ICD-10-CM | POA: Insufficient documentation

## 2023-02-12 DIAGNOSIS — F17210 Nicotine dependence, cigarettes, uncomplicated: Secondary | ICD-10-CM | POA: Insufficient documentation

## 2023-02-12 DIAGNOSIS — J029 Acute pharyngitis, unspecified: Secondary | ICD-10-CM | POA: Diagnosis not present

## 2023-02-12 DIAGNOSIS — R062 Wheezing: Secondary | ICD-10-CM | POA: Diagnosis present

## 2023-02-12 LAB — RESP PANEL BY RT-PCR (RSV, FLU A&B, COVID)  RVPGX2
Influenza A by PCR: NEGATIVE
Influenza B by PCR: NEGATIVE
Resp Syncytial Virus by PCR: NEGATIVE
SARS Coronavirus 2 by RT PCR: NEGATIVE

## 2023-02-12 LAB — GROUP A STREP BY PCR: Group A Strep by PCR: NOT DETECTED

## 2023-02-12 MED ORDER — PREDNISONE 10 MG PO TABS: 40.0000 mg | ORAL_TABLET | Freq: Every day | ORAL | 0 refills | Status: AC

## 2023-02-12 MED ORDER — ALBUTEROL SULFATE (2.5 MG/3ML) 0.083% IN NEBU: 2.5000 mg | INHALATION_SOLUTION | Freq: Four times a day (QID) | RESPIRATORY_TRACT | 12 refills | Status: AC | PRN

## 2023-02-12 MED ORDER — PREDNISONE 50 MG PO TABS
60.0000 mg | ORAL_TABLET | Freq: Once | ORAL | Status: AC
  Administered 2023-02-12: 60 mg via ORAL
  Filled 2023-02-12: qty 1

## 2023-02-12 MED ORDER — LORAZEPAM 0.5 MG PO TABS
0.5000 mg | ORAL_TABLET | Freq: Once | ORAL | Status: AC
  Administered 2023-02-12: 0.5 mg via ORAL
  Filled 2023-02-12: qty 1

## 2023-02-12 MED ORDER — IPRATROPIUM-ALBUTEROL 0.5-2.5 (3) MG/3ML IN SOLN
9.0000 mL | Freq: Once | RESPIRATORY_TRACT | Status: AC
  Administered 2023-02-12: 9 mL via RESPIRATORY_TRACT
  Filled 2023-02-12: qty 3

## 2023-02-12 MED ORDER — BUDESONIDE-FORMOTEROL FUMARATE 160-4.5 MCG/ACT IN AERO: 2.0000 | INHALATION_SPRAY | Freq: Two times a day (BID) | RESPIRATORY_TRACT | 12 refills | Status: AC

## 2023-02-12 NOTE — ED Notes (Signed)
Nasal swab performed

## 2023-02-12 NOTE — ED Provider Notes (Signed)
[place] EMERGENCY DEPARTMENT AT [facility] Provider Note  CSN: [id number] Arrival date & time: 02/12/23 4098  Chief Complaint(s) Shortness of Breath  HPI Emily Vasquez is a 42 y.o. female with PMH tobacco use disorder, asthma, fibromyalgia who presents emergency department for evaluation of multiple complaints including dental pain and swelling, cough, shortness of breath, wheezing and sore throat.  Patient states that over the last 1 week she has had persistent shortness of breath and cough as well as some tenderness near the angle of the mandible at the site of a known tooth infection.  She states that she has been using her home albuterol without relief and states that she will wake up in the morning with coughing fits that cause her to vomit.  She states that she has a child who has recurrent strep throat as well and is concerned that she may have contracted this.  She does not follow with a pulmonologist and does not take a daily inhaler.  Denies trismus, dysphagia, abdominal pain, nausea, headache, fever or other systemic symptoms.     Past Medical History Past Medical History:  Diagnosis Date   ADD (attention deficit disorder)    Anxiety    Asthma    Degenerative disc disease, cervical    Fibromyalgia    GERD (gastroesophageal reflux disease)    Low back pain    Ovarian cyst    Ovarian torsion    Patient Active Problem List   Diagnosis Date Noted   S/P hysterectomy 06/17/2017   Complex ovarian cyst 05/28/2017   Enlarged ovary 05/28/2017   Pelvic pain 05/28/2017   Home Medication(s) Prior to Admission medications   Medication Sig Start Date End Date Taking? Authorizing Provider  albuterol (PROVENTIL) (2.5 MG/3ML) 0.083% nebulizer solution Take 3 mLs (2.5 mg total) by nebulization every 6 (six) hours as needed for wheezing or shortness of breath. 02/12/23  Yes Gillian Meeuwsen, MD  albuterol (VENTOLIN HFA) 108 (90 Base) MCG/ACT inhaler Inhale 1-2 puffs into the  lungs every 6 (six) hours as needed for wheezing or shortness of breath. 07/18/21  Yes Achille Rich, PA-C  budesonide-formoterol [facility]) 160-4.5 MCG/ACT inhaler Inhale 2 puffs into the lungs 2 (two) times daily. 02/12/23 03/14/23 Yes Marten Iles, MD  gabapentin (NEURONTIN) 300 MG capsule Take 300 mg by mouth daily.   Yes [provider]  hydrOXYzine (ATARAX/VISTARIL) 25 MG tablet Take 1 tablet (25 mg total) by mouth every 6 (six) hours as needed for itching. 07/03/17  Yes Loren Racer, MD  levocetirizine (XYZAL) 5 MG tablet Take 1 tablet (5 mg total) by mouth every evening. 07/06/17  Yes Lazaro Arms, MD  omeprazole (PRILOSEC) 20 MG capsule Take 20 mg by mouth daily.   Yes [provider]  predniSONE (DELTASONE) 10 MG tablet Take 4 tablets (40 mg total) by mouth daily for 4 days. 02/12/23 02/16/23 Yes Ranveer Wahlstrom, MD  sertraline (ZOLOFT) 100 MG tablet Take 100 mg by mouth daily. 12/29/22  Yes [provider]  Past Surgical History Past Surgical History:  Procedure Laterality Date   ABDOMINAL HYSTERECTOMY Left 06/17/2017   Procedure: HYSTERECTOMY ABDOMINAL; LEFT SALPINGO OOPHORECTOMY;  Surgeon: Lazaro Arms, MD;  Location: [facility];  Service: Gynecology;  Laterality: Left;   CESAREAN SECTION     HAND SURGERY     LAPAROSCOPIC SALPINGO OOPHERECTOMY Left 06/17/2017   Procedure: diagnostic laproscopy;  Surgeon: Lazaro Arms, MD;  Location: [facility];  Service: Gynecology;  Laterality: Left;  decision to open at 1330   OOPHORECTOMY     TUBAL LIGATION     Family History Family History  Problem Relation Age of Onset   Alcohol abuse Paternal Grandfather    Mental illness Maternal Grandmother    Diabetes Maternal Grandmother    Heart attack Maternal Grandfather    Heart attack Father    High Cholesterol Father    Diabetes Brother    Stroke  Brother    Asthma Daughter    Asthma Son     Social History Social History   Tobacco Use   Smoking status: Every Day    Packs/day: 0.75    Years: 26.00    Additional pack years: 0.00    Total pack years: 19.50    Types: Cigarettes   Smokeless tobacco: Never  Vaping Use   Vaping Use: Never used  Substance Use Topics   Alcohol use: Yes    Comment: occasional   Drug use: No   Allergies Compazine [prochlorperazine maleate]  Review of Systems Review of Systems  HENT:  Positive for dental problem.   Respiratory:  Positive for cough, shortness of breath and wheezing.     Physical Exam Vital Signs  I have reviewed the triage vital signs BP (!) 152/95   Pulse 65   Temp 97.9 F (36.6 C) (Oral)   Resp 19   Ht 5\' 11"  (1.803 m)   Wt 108.9 kg   LMP 05/12/2017   SpO2 100%   BMI 33.47 kg/m   Physical Exam Vitals and nursing note reviewed.  Constitutional:      General: She is not in acute distress.    Appearance: She is well-developed.  HENT:     Head: Normocephalic and atraumatic.     Comments: Dental caries mild soft tissue swelling Eyes:     Conjunctiva/sclera: Conjunctivae normal.  Cardiovascular:     Rate and Rhythm: Normal rate and regular rhythm.     Heart sounds: No murmur heard. Pulmonary:     Effort: Pulmonary effort is normal. No respiratory distress.     Breath sounds: Wheezing present.  Abdominal:     Palpations: Abdomen is soft.     Tenderness: There is no abdominal tenderness.  Musculoskeletal:        General: No swelling.     Cervical back: Neck supple.  Skin:    General: Skin is warm and dry.     Capillary Refill: Capillary refill takes less than 2 seconds.  Neurological:     Mental Status: She is alert.  Psychiatric:        Mood and Affect: Mood normal.     ED Results and Treatments Labs (all labs ordered are listed, but only abnormal results are displayed) Labs Reviewed  RESP PANEL BY RT-PCR (RSV, FLU A&B, COVID)  RVPGX2  GROUP A  STREP BY PCR  Radiology DG Chest 2 View  Result Date: 02/12/2023 CLINICAL DATA:  dyspnea, r/o PNA EXAM: CHEST - 2 VIEW COMPARISON:  January 30, 2022 FINDINGS: The cardiomediastinal silhouette is normal in contour. No pleural effusion. No pneumothorax. Persistent bronchitic markings and peribronchial cuffing. No acute pleuroparenchymal abnormality. Visualized abdomen is unremarkable. No acute osseous abnormality noted. IMPRESSION: Persistent bronchitic markings and peribronchial cuffing. Differential considerations include reactive airways disease versus bronchitis. Electronically Signed   By: Meda Klinefelter M.D.   On: 02/12/2023 08:55    Pertinent labs & imaging results that were available during my care of the patient were reviewed by me and considered in my medical decision making (see MDM for details).  Medications Ordered in ED Medications  ipratropium-albuterol (DUONEB) 0.5-2.5 (3) MG/3ML nebulizer solution 9 mL (9 mLs Nebulization Given 02/12/23 1015)  predniSONE (DELTASONE) tablet 60 mg (60 mg Oral Given 02/12/23 0928)  LORazepam (ATIVAN) tablet 0.5 mg (0.5 mg Oral Given 02/12/23 0950)                                                                                                                                     Procedures .Critical Care  Performed by: Glendora Score, MD Authorized by: Glendora Score, MD   Critical care provider statement:    Critical care time (minutes):  30   Critical care was necessary to treat or prevent imminent or life-threatening deterioration of the following conditions:  Respiratory failure   Critical care was time spent personally by me on the following activities:  Development of treatment plan with patient or surrogate, discussions with consultants, evaluation of patient's response to treatment, examination of patient, ordering and review  of laboratory studies, ordering and review of radiographic studies, ordering and performing treatments and interventions, pulse oximetry, re-evaluation of patient's condition and review of old charts   (including critical care time)  Medical Decision Making / ED Course   This patient presents to the ED for concern of shortness of breath, cough, dental pain, this involves an extensive number of treatment options, and is a complaint that carries with it a high risk of complications and morbidity.  The differential diagnosis includes asthma exacerbation, COPD, pneumonia, strep throat, viral URI, dental abscess, submandibular abscess, Ludwigs angina  MDM: Patient seen emergency room for evaluation of multiple complaints described above.  Physical exam with wheezing bilaterally but minimal accessory muscle use.  Tenderness and mild swelling along the jawline on the left but no true submental swelling or physical exam evidence of Ludwigs.  COVID, flu, RSV negative.  Group A strep negative.  Chest x-ray with no evidence of pneumonia.  Patient received 3 DuoNebs and prednisone and on reevaluation her symptoms have improved significantly.  Patient is requesting nebulized treatments at home which I prescribed to the patient's pharmacy.  She also does not have a daily controller inhaler and thus she was discharged with a prescription for Symbicort and a 4-day  course of prednisone.  I did also place an ambulatory referral to pulmonology which the patient will call and set up an appointment.  Lastly, I did have an extended discussion with the patient on smoking cessation and she does voice a desire to quit.  She will reach out to her primary care physician or have a discussion with a pulmonologist about this.  Patient then discharged   Additional history obtained:  -External records from outside source obtained and reviewed including: Chart review including previous notes, labs, imaging, consultation  notes   Lab Tests: -I ordered, reviewed, and interpreted labs.   The pertinent results include:   Labs Reviewed  RESP PANEL BY RT-PCR (RSV, FLU A&B, COVID)  RVPGX2  GROUP A STREP BY PCR      EKG   EKG Interpretation  Date/Time:  Thursday Feb 12 2023 08:51:13 EDT Ventricular Rate:  62 PR Interval:  135 QRS Duration: 98 QT Interval:  448 QTC Calculation: 455 R Axis:   55 Text Interpretation: Sinus rhythm Low voltage, precordial leads Confirmed by Girtie Wiersma ([id number]) on 02/12/2023 10:19:23 AM         Imaging Studies ordered: I ordered imaging studies including chest x-ray I independently visualized and interpreted imaging. I agree with the radiologist interpretation   Medicines ordered and prescription drug management: Meds ordered this encounter  Medications   ipratropium-albuterol (DUONEB) 0.5-2.5 (3) MG/3ML nebulizer solution 9 mL   predniSONE (DELTASONE) tablet 60 mg   LORazepam (ATIVAN) tablet 0.5 mg   budesonide-formoterol (SYMBICORT) 160-4.5 MCG/ACT inhaler    Sig: Inhale 2 puffs into the lungs 2 (two) times daily.    Dispense:  1 each    Refill:  12   predniSONE (DELTASONE) 10 MG tablet    Sig: Take 4 tablets (40 mg total) by mouth daily for 4 days.    Dispense:  16 tablet    Refill:  0   albuterol (PROVENTIL) (2.5 MG/3ML) 0.083% nebulizer solution    Sig: Take 3 mLs (2.5 mg total) by nebulization every 6 (six) hours as needed for wheezing or shortness of breath.    Dispense:  75 mL    Refill:  12    -I have reviewed the patients home medicines and have made adjustments as needed  Critical interventions Multiple DuoNebs, steroids     Cardiac Monitoring: The patient was maintained on a cardiac monitor.  I personally viewed and interpreted the cardiac monitored which showed an underlying rhythm of: NSR  Social Determinants of Health:  Factors impacting patients care include: Long discussion on smoking cessation   Reevaluation: After the  interventions noted above, I reevaluated the patient and found that they have :improved  Co morbidities that complicate the patient evaluation  Past Medical History:  Diagnosis Date   ADD (attention deficit disorder)    Anxiety    Asthma    Degenerative disc disease, cervical    Fibromyalgia    GERD (gastroesophageal reflux disease)    Low back pain    Ovarian cyst    Ovarian torsion       Dispostion: I considered admission for this patient, but at this time with symptoms improved she does not meet inpatient criteria for admission and is safe for discharge with outpatient follow-up     Final Clinical Impression(s) / ED Diagnoses Final diagnoses:  Wheezing  Sore throat     @PCDICTATION @    Caymen Dubray, Wyn Forster, MD 02/12/23 1800

## 2023-02-12 NOTE — ED Triage Notes (Signed)
Pt c/o sob and URI sx x one week; pt has hx of asthma

## 2023-02-12 NOTE — ED Notes (Signed)
Pt also complains joints throbbing. States she has Fibromyalgia.

## 2023-02-22 NOTE — Progress Notes (Deleted)
GI Office Note    Referring Provider: Benetta Spar* Primary Care Physician:  Benetta Spar, MD  Primary Gastroenterologist:  Chief Complaint   No chief complaint on file.    History of Present Illness   Emily Vasquez is a 42 y.o. female presenting today  female presenting today at the request of Dr. Felecia Shelling for further evaluation of abnormal LFTs.   Seen in the ED May 2 with dental pain, cough, shortness of breath, sore throat, exposure to strep.  Negative for strep, COVID, flu, RSV.  Chest x-ray negative for pneumonia.  She was noted to have persistent bronchitic markings and peribronchial cuffing, differential including reactive airway disease versus bronchitis.   Labs***from referral   ADHD ETOH  COPD          Medications   Current Outpatient Medications  Medication Sig Dispense Refill   albuterol (PROVENTIL) (2.5 MG/3ML) 0.083% nebulizer solution Take 3 mLs (2.5 mg total) by nebulization every 6 (six) hours as needed for wheezing or shortness of breath. 75 mL 12   albuterol (VENTOLIN HFA) 108 (90 Base) MCG/ACT inhaler Inhale 1-2 puffs into the lungs every 6 (six) hours as needed for wheezing or shortness of breath. 1 each 0   budesonide-formoterol (SYMBICORT) 160-4.5 MCG/ACT inhaler Inhale 2 puffs into the lungs 2 (two) times daily. 1 each 12   gabapentin (NEURONTIN) 300 MG capsule Take 300 mg by mouth daily.     hydrOXYzine (ATARAX/VISTARIL) 25 MG tablet Take 1 tablet (25 mg total) by mouth every 6 (six) hours as needed for itching. 20 tablet 0   levocetirizine (XYZAL) 5 MG tablet Take 1 tablet (5 mg total) by mouth every evening. 30 tablet 1   omeprazole (PRILOSEC) 20 MG capsule Take 20 mg by mouth daily.     sertraline (ZOLOFT) 100 MG tablet Take 100 mg by mouth daily.     No current facility-administered medications for this visit.    Allergies   Allergies as of 02/23/2023 - Review Complete 02/12/2023  Allergen Reaction Noted    Compazine [prochlorperazine maleate] Other (See Comments) 05/24/2017    Past Medical History   Past Medical History:  Diagnosis Date   ADD (attention deficit disorder)    Anxiety    Asthma    Degenerative disc disease, cervical    Fibromyalgia    GERD (gastroesophageal reflux disease)    Low back pain    Ovarian cyst    Ovarian torsion     Past Surgical History   Past Surgical History:  Procedure Laterality Date   ABDOMINAL HYSTERECTOMY Left 06/17/2017   Procedure: HYSTERECTOMY ABDOMINAL; LEFT SALPINGO OOPHORECTOMY;  Surgeon: Lazaro Arms, MD;  Location: [facility];  Service: Gynecology;  Laterality: Left;   CESAREAN SECTION     HAND SURGERY     LAPAROSCOPIC SALPINGO OOPHERECTOMY Left 06/17/2017   Procedure: diagnostic laproscopy;  Surgeon: Lazaro Arms, MD;  Location: [facility];  Service: Gynecology;  Laterality: Left;  decision to open at 1330   OOPHORECTOMY     TUBAL LIGATION      Past Family History   Family History  Problem Relation Age of Onset   Alcohol abuse Paternal Grandfather    Mental illness Maternal Grandmother    Diabetes Maternal Grandmother    Heart attack Maternal Grandfather    Heart attack Father    High Cholesterol Father    Diabetes Brother    Stroke Brother    Asthma Daughter    Asthma  Son     Past Social History   Social History   Socioeconomic History   Marital status: Single    Spouse name: Not on file   Number of children: Not on file   Years of education: Not on file   Highest education level: Not on file  Occupational History   Not on file  Tobacco Use   Smoking status: Every Day    Packs/day: 0.75    Years: 26.00    Additional pack years: 0.00    Total pack years: 19.50    Types: Cigarettes   Smokeless tobacco: Never  Vaping Use   Vaping Use: Never used  Substance and Sexual Activity   Alcohol use: Yes    Comment: occasional   Drug use: No   Sexual activity: Not Currently    Birth control/protection: Surgical     Comment: tubal  Other Topics Concern   Not on file  Social History Narrative   Not on file   Social Determinants of Health   Financial Resource Strain: Not on file  Food Insecurity: Not on file  Transportation Needs: Not on file  Physical Activity: Not on file  Stress: Not on file  Social Connections: Not on file  Intimate Partner Violence: Not on file    Review of Systems   General: Negative for anorexia, weight loss, fever, chills, fatigue, weakness. Eyes: Negative for vision changes.  ENT: Negative for hoarseness, difficulty swallowing , nasal congestion. CV: Negative for chest pain, angina, palpitations, dyspnea on exertion, peripheral edema.  Respiratory: Negative for dyspnea at rest, dyspnea on exertion, cough, sputum, wheezing.  GI: See history of present illness. GU:  Negative for dysuria, hematuria, urinary incontinence, urinary frequency, nocturnal urination.  MS: Negative for joint pain, low back pain.  Derm: Negative for rash or itching.  Neuro: Negative for weakness, abnormal sensation, seizure, frequent headaches, memory loss,  confusion.  Psych: Negative for anxiety, depression, suicidal ideation, hallucinations.  Endo: Negative for unusual weight change.  Heme: Negative for bruising or bleeding. Allergy: Negative for rash or hives.  Physical Exam   LMP 05/12/2017    General: Well-nourished, well-developed in no acute distress.  Head: Normocephalic, atraumatic.   Eyes: Conjunctiva pink, no icterus. Mouth: Oropharyngeal mucosa moist and pink , no lesions erythema or exudate. Neck: Supple without thyromegaly, masses, or lymphadenopathy.  Lungs: Clear to auscultation bilaterally.  Heart: Regular rate and rhythm, no murmurs rubs or gallops.  Abdomen: Bowel sounds are normal, nontender, nondistended, no hepatosplenomegaly or masses,  no abdominal bruits or hernia, no rebound or guarding.   Rectal: *** Extremities: No lower extremity edema. No clubbing or  deformities.  Neuro: Alert and oriented x 4 , grossly normal neurologically.  Skin: Warm and dry, no rash or jaundice.   Psych: Alert and cooperative, normal mood and affect.  Labs   *** Imaging Studies   DG Chest 2 View  Result Date: 02/12/2023 CLINICAL DATA:  dyspnea, r/o PNA EXAM: CHEST - 2 VIEW COMPARISON:  January 30, 2022 FINDINGS: The cardiomediastinal silhouette is normal in contour. No pleural effusion. No pneumothorax. Persistent bronchitic markings and peribronchial cuffing. No acute pleuroparenchymal abnormality. Visualized abdomen is unremarkable. No acute osseous abnormality noted. IMPRESSION: Persistent bronchitic markings and peribronchial cuffing. Differential considerations include reactive airways disease versus bronchitis. Electronically Signed   By: Meda Klinefelter M.D.   On: 02/12/2023 08:55    Assessment       PLAN   ***   Leanna Battles. Melvyn Neth, MHS, PA-C  [facility]

## 2023-02-23 ENCOUNTER — Encounter: Payer: Self-pay | Admitting: Gastroenterology

## 2023-02-23 ENCOUNTER — Ambulatory Visit: Payer: 59 | Admitting: Gastroenterology

## 2023-02-25 ENCOUNTER — Telehealth: Payer: Self-pay | Admitting: Diagnostic Neuroimaging

## 2023-02-25 ENCOUNTER — Ambulatory Visit (INDEPENDENT_AMBULATORY_CARE_PROVIDER_SITE_OTHER): Payer: 59 | Admitting: Diagnostic Neuroimaging

## 2023-02-25 ENCOUNTER — Encounter: Payer: Self-pay | Admitting: Diagnostic Neuroimaging

## 2023-02-25 VITALS — BP 137/92 | HR 69 | Ht 71.0 in | Wt 259.0 lb

## 2023-02-25 DIAGNOSIS — R200 Anesthesia of skin: Secondary | ICD-10-CM

## 2023-02-25 DIAGNOSIS — M5442 Lumbago with sciatica, left side: Secondary | ICD-10-CM | POA: Diagnosis not present

## 2023-02-25 DIAGNOSIS — M791 Myalgia, unspecified site: Secondary | ICD-10-CM

## 2023-02-25 DIAGNOSIS — R202 Paresthesia of skin: Secondary | ICD-10-CM

## 2023-02-25 DIAGNOSIS — M542 Cervicalgia: Secondary | ICD-10-CM

## 2023-02-25 DIAGNOSIS — M2550 Pain in unspecified joint: Secondary | ICD-10-CM

## 2023-02-25 DIAGNOSIS — R269 Unspecified abnormalities of gait and mobility: Secondary | ICD-10-CM

## 2023-02-25 DIAGNOSIS — G8929 Other chronic pain: Secondary | ICD-10-CM

## 2023-02-25 DIAGNOSIS — M48062 Spinal stenosis, lumbar region with neurogenic claudication: Secondary | ICD-10-CM

## 2023-02-25 DIAGNOSIS — M5441 Lumbago with sciatica, right side: Secondary | ICD-10-CM

## 2023-02-25 NOTE — Telephone Encounter (Signed)
[employer] medicare/[employer] medicaid NPR sent to GI [phone] 
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
## 2023-02-25 NOTE — Patient Instructions (Signed)
GAIT DIFFICULTY / NUMBNESS / MYALGIA / ARTHRALGIA - MRI brain (rule out multiple sclerosis) - check labs - consider PT / OT referral (has tried in the past) - consider rheumatology consult for myalgia / arthralgias  NECK PAIN / ARM PAIN / numbness  / spasms - MRI cervical spine (rule out multiple sclerosis, cervical myelopathy, radiculopathy)  LOW BACK PAIN / NEUROGENIC CLAUDICATION, FALLS, LEG WEAKNESS - MRI lumbar spine (rule out lumbar spinal stenosis) - fall precautions reviewed

## 2023-02-28 LAB — VITAMIN B12: Vitamin B-12: 590 pg/mL (ref 232–1245)

## 2023-02-28 LAB — ANA,IFA RA DIAG PNL W/RFLX TIT/PATN
ANA Titer 1: NEGATIVE
Cyclic Citrullin Peptide Ab: 7 units (ref 0–19)
Rheumatoid fact SerPl-aCnc: <10 IU/mL (ref <14.0)

## 2023-02-28 LAB — ALDOLASE: Aldolase: 17.2 U/L — ABNORMAL HIGH (ref 3.3–10.3)

## 2023-02-28 LAB — CK: Total CK: 736 U/L (ref 32–182)

## 2023-02-28 LAB — ANGIOTENSIN CONVERTING ENZYME: Angio Convert Enzyme: 39 U/L (ref 14–82)

## 2023-03-03 ENCOUNTER — Telehealth: Payer: Self-pay

## 2023-03-03 NOTE — Telephone Encounter (Signed)
FYI, NCS/EMG has been scheduled for 06-27 @ 12:30pm

## 2023-03-03 NOTE — Telephone Encounter (Signed)
I called patient. I discussed her lab results and recommendations with her. She is agreeable to an NCV/EMG. Her MRIs are scheduled for June. We will call her with those results when available. Pt verbalized understanding of results. Pt had no questions at this time but was encouraged to call back if questions arise.

## 2023-03-03 NOTE — Telephone Encounter (Signed)
-----   Message from Suanne Marker, MD sent at 03/03/2023  2:53 PM EDT ----- Ck and ladolase are elevated, so I will check EMG/NCS testing to look for muscle diseases (myopathy). Also needs MRI follow up. -VRP

## 2023-03-03 NOTE — Addendum Note (Signed)
Addended by: Joycelyn Schmid R on: 03/03/2023 02:53 PM   Modules accepted: Orders

## 2023-03-11 ENCOUNTER — Encounter: Payer: Self-pay | Admitting: Pulmonary Disease

## 2023-03-11 ENCOUNTER — Ambulatory Visit (INDEPENDENT_AMBULATORY_CARE_PROVIDER_SITE_OTHER): Payer: 59 | Admitting: Pulmonary Disease

## 2023-03-11 VITALS — BP 117/77 | HR 84 | Ht 71.0 in | Wt 259.0 lb

## 2023-03-11 DIAGNOSIS — Z720 Tobacco use: Secondary | ICD-10-CM | POA: Diagnosis not present

## 2023-03-11 DIAGNOSIS — J4540 Moderate persistent asthma, uncomplicated: Secondary | ICD-10-CM | POA: Diagnosis not present

## 2023-03-11 NOTE — Assessment & Plan Note (Signed)
Smoking cessation advised as the most important intervention for lung health. We discussed the effect of smoking on lung function. Will check PFTs

## 2023-03-11 NOTE — Patient Instructions (Signed)
  X schedule pFTs @ [facility]  Use symbicort - 2 puffs twice daily, rinse mouth after use
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
## 2023-03-11 NOTE — Assessment & Plan Note (Signed)
She seems to have persistent symptoms.  She was given Symbicort MDI from the emergency room after recent exacerbation and I asked her to be more compliant and use this at least twice daily.  If compliance is an issue in the future, we can consider once daily steroid/LABA combination. She will continue to use albuterol on an as-needed basis but hopefully not needed this as much.  She will use Xyzal for allergies and omeprazole for reflux to avoid these triggers. I have also asked her to pursue treatment of anxiety with either behavioral health or with her PCP since this complicates treatment of her asthma

## 2023-04-04 ENCOUNTER — Ambulatory Visit
Admission: RE | Admit: 2023-04-04 | Discharge: 2023-04-04 | Disposition: A | Discharge status: Home / Self Care | Payer: 59 | Source: Ambulatory Visit | Attending: Diagnostic Neuroimaging | Admitting: Diagnostic Neuroimaging

## 2023-04-04 DIAGNOSIS — G8929 Other chronic pain: Secondary | ICD-10-CM

## 2023-04-04 DIAGNOSIS — R200 Anesthesia of skin: Secondary | ICD-10-CM

## 2023-04-04 DIAGNOSIS — M542 Cervicalgia: Secondary | ICD-10-CM

## 2023-04-04 DIAGNOSIS — M48062 Spinal stenosis, lumbar region with neurogenic claudication: Secondary | ICD-10-CM

## 2023-04-04 DIAGNOSIS — R269 Unspecified abnormalities of gait and mobility: Secondary | ICD-10-CM

## 2023-04-04 DIAGNOSIS — R202 Paresthesia of skin: Secondary | ICD-10-CM

## 2023-04-04 MED ORDER — GADOPICLENOL 0.5 MMOL/ML IV SOLN
10.0000 mL | Freq: Once | INTRAVENOUS | Status: AC | PRN
  Administered 2023-04-04: 10 mL via INTRAVENOUS

## 2023-04-07 ENCOUNTER — Encounter: Payer: Self-pay | Admitting: Gastroenterology

## 2023-04-07 ENCOUNTER — Ambulatory Visit: Payer: 59 | Admitting: Gastroenterology

## 2023-04-09 ENCOUNTER — Ambulatory Visit (INDEPENDENT_AMBULATORY_CARE_PROVIDER_SITE_OTHER): Payer: 59 | Admitting: Diagnostic Neuroimaging

## 2023-04-09 ENCOUNTER — Ambulatory Visit (INDEPENDENT_AMBULATORY_CARE_PROVIDER_SITE_OTHER): Payer: Self-pay | Admitting: Diagnostic Neuroimaging

## 2023-04-09 VITALS — Ht 71.0 in

## 2023-04-09 DIAGNOSIS — R269 Unspecified abnormalities of gait and mobility: Secondary | ICD-10-CM

## 2023-04-09 DIAGNOSIS — M791 Myalgia, unspecified site: Secondary | ICD-10-CM

## 2023-04-09 DIAGNOSIS — R2689 Other abnormalities of gait and mobility: Secondary | ICD-10-CM | POA: Diagnosis not present

## 2023-04-09 DIAGNOSIS — Z0289 Encounter for other administrative examinations: Secondary | ICD-10-CM

## 2023-04-09 NOTE — Progress Notes (Signed)
Myopathic changes on EMG. Will check addl labs, and then plan for muscle biopsy (left deltoid, left biceps).  Orders Placed This Encounter  Procedures   CK   Aldolase   MyoMarker 3 Plus Profile (RDL)   Suanne Marker, MD 04/09/2023, 1:55 PM Certified in Neurology, Neurophysiology and Neuroimaging  [facility] [address] [place], [place] [address] [phone]

## 2023-04-09 NOTE — Procedures (Signed)
[facility]  NCS (NERVE CONDUCTION STUDY) WITH EMG (ELECTROMYOGRAPHY) REPORT   STUDY DATE: 04/09/23 PATIENT NAME: Emily Vasquez DOB: November 02, 1980 MRN: [id number]  ORDERING CLINICIAN: Joycelyn Schmid, MD   TECHNOLOGIST: Jenelle Mages ELECTROMYOGRAPHER: Glenford Bayley. Briceida Rasberry, MD  CLINICAL INFORMATION: 42 year old female with muscle pain and elevated CK.  FINDINGS: NERVE CONDUCTION STUDY:  Right median motor response is normal.  Right ulnar motor response could not be obtained.    Right peroneal and right tibial motor responses are normal.  Right tibial F-wave latency is normal.  Right median sensory response has slightly prolonged peak latency and normal amplitude.  Right ulnar sensory response has prolonged peak latency and decreased amplitude.  Right median to ulnar transcarpal study is normal.  Right sural and superficial peroneal sensory responses are normal.   NEEDLE ELECTROMYOGRAPHY:  Needle examination of right upper and lower extremities notable for early recruitment of polyphasic, increased duration motor units in the right deltoid, biceps, triceps, flexor carpi radialis muscles.  Decreased recruitment of large motor units noted in the right flexor carpi ulnaris and right first dorsal interosseous muscles.  Right cervical paraspinal muscles are normal.  Right vastus medialis, tibialis anterior, gastrocnemius muscles are normal.    IMPRESSION:   Abnormal study demonstrating: - Myopathic recruitment pattern noted within the right upper extremity muscles. 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 - No evidence of underlying widespread large fiber polyneuropathy.   INTERPRETING PHYSICIAN:  Suanne Marker, MD Certified in Neurology, Neurophysiology and Neuroimaging  [facility] [address] [place], [place] [address] [phone]   [facility]    Nerve / Sites Muscle Latency Ref. Amplitude  Ref. Rel Amp Segments Distance Velocity Ref. Area    ms ms mV mV %  cm m/s m/s mVms  R Median - APB     Wrist APB 3.6 ?4.4 11.1 ?4.0 100 Wrist - APB 7   31.8     Upper arm APB 8.5  10.7  97 Upper arm - Wrist 26.6 55 ?49 30.7  R Ulnar - ADM     Wrist ADM NR ?3.3 NR ?6.0 NR Wrist - ADM 7   NR     B.Elbow ADM NR  NR  NR B.Elbow - Wrist   ?49 NR     A.Elbow ADM      A.Elbow - B.Elbow   ?49   R Peroneal - EDB     Ankle EDB 5.1 ?6.5 3.8 ?2.0 100 Ankle - EDB 9   11.4     Fib head EDB 11.5  3.5  91.8 Fib head - Ankle 30 47 ?44 11.7     Pop fossa EDB 13.7  3.3  96.3 Pop fossa - Fib head 10 45 ?44 11.5         Pop fossa - Ankle      R Tibial - AH     Ankle AH 5.1 ?5.8 8.9 ?4.0 100 Ankle - AH 9   14.6     Pop fossa AH 13.4  5.0  55.7 Pop fossa - Ankle 37 45 ?41 13.2             SNC    Nerve / Sites Rec. Site Peak Lat Ref.  Amp Ref. Segments Distance Peak Diff Ref.    ms ms V V  cm ms ms  R Sural - Ankle (Calf)     Calf Ankle 3.3 ?4.4 7 ?6 Calf -  Ankle 14    R Superficial peroneal - Ankle     Lat leg Ankle 3.8 ?4.4 6 ?6 Lat leg - Ankle 14    R Median, Ulnar - Transcarpal comparison     Median Palm Wrist 2.4 ?2.2 56 ?35 Median Palm - Wrist 8       Ulnar Palm Wrist 2.0 ?2.2 6 ?12 Ulnar Palm - Wrist 8          Median Palm - Ulnar Palm  0.4 ?0.4  R Median - Orthodromic (Dig II, Mid palm)     Dig II Wrist 3.6 ?3.4 21 ?10 Dig II - Wrist 13    R Ulnar - Orthodromic, (Dig V, Mid palm)     Dig V Wrist 3.3 ?3.1 3 ?5 Dig V - Wrist 5                 F  Wave    Nerve F Lat Ref.   ms ms  R Tibial - AH 51.3 ?56.0       EMG Summary Table    Spontaneous MUAP Recruitment  Muscle IA Fib PSW Fasc Other Amp Dur. Poly Pattern  R. Deltoid Normal None None None _______ Normal Normal 1+ Early  R. Biceps brachii Normal None None None _______ Normal Increased 1+ Early  R. Triceps brachii Normal None None None _______ Normal Normal 1+ Early  R. Flexor carpi radialis Normal None None None _______ Normal  Increased 1+ Early  R. First dorsal interosseous Normal None 1+ None _______ Increased Normal Normal Reduced  R. Flexor carpi ulnaris Normal None None None _______ Increased Normal Normal Reduced  R. Vastus medialis Normal None None None _______ Normal Normal Normal Normal  R. Tibialis anterior Normal None None None _______ Normal Normal Normal Normal  R. Gastrocnemius (Medial head) Normal None None None _______ Normal Normal Normal Normal  R. Cervical paraspinals Normal None None None _______ Normal Normal Normal Normal

## 2023-04-11 LAB — ALDOLASE: Aldolase: 11 U/L — ABNORMAL HIGH (ref 3.3–10.3)

## 2023-04-11 LAB — CK: Total CK: 156 U/L (ref 32–182)

## 2023-04-24 LAB — MYOMARKER 3 PLUS PROFILE (RDL)

## 2023-05-01 ENCOUNTER — Telehealth: Payer: Self-pay

## 2023-05-01 NOTE — Telephone Encounter (Signed)
-----   Message from Suanne Marker sent at 04/30/2023  6:53 PM EDT ----- Normal lab panel. -VRP

## 2023-05-01 NOTE — Telephone Encounter (Signed)
1st attempt: [id number]

## 2023-05-04 ENCOUNTER — Telehealth: Payer: Self-pay | Admitting: Diagnostic Neuroimaging

## 2023-05-04 ENCOUNTER — Telehealth: Payer: Self-pay

## 2023-05-04 NOTE — Telephone Encounter (Signed)
-----   Message from Suanne Marker sent at 04/30/2023  6:53 PM EDT ----- Normal lab panel. -VRP

## 2023-05-04 NOTE — Telephone Encounter (Signed)
ROUTING TO THE PHONE ROOM: Normal results means no changes in the plan of care stay the course.

## 2023-05-04 NOTE — Telephone Encounter (Signed)
Called and LVM for pt with this information.

## 2023-05-04 NOTE — Telephone Encounter (Signed)
I left a voicemail detailing normal results (as per DPR).

## 2023-05-04 NOTE — Telephone Encounter (Signed)
Pt wants to know since labs are normal what is the next step in her care. Pt requesting a call back from nurse.

## 2023-12-28 ENCOUNTER — Other Ambulatory Visit (HOSPITAL_COMMUNITY): Payer: Self-pay | Admitting: Internal Medicine

## 2023-12-28 DIAGNOSIS — Z1231 Encounter for screening mammogram for malignant neoplasm of breast: Secondary | ICD-10-CM

## 2024-01-11 ENCOUNTER — Ambulatory Visit (HOSPITAL_COMMUNITY)

## 2024-01-15 LAB — LAB REPORT - SCANNED
EGFR: 87
HM Hepatitis Screen: NEGATIVE

## 2024-01-26 ENCOUNTER — Ambulatory Visit (INDEPENDENT_AMBULATORY_CARE_PROVIDER_SITE_OTHER): Admitting: Gastroenterology

## 2024-01-26 ENCOUNTER — Encounter: Payer: Self-pay | Admitting: *Deleted

## 2024-01-26 ENCOUNTER — Encounter: Payer: Self-pay | Admitting: Gastroenterology

## 2024-01-26 ENCOUNTER — Other Ambulatory Visit: Payer: Self-pay | Admitting: *Deleted

## 2024-01-26 VITALS — BP 137/87 | HR 87 | Temp 97.7°F | Ht 71.0 in | Wt 249.8 lb

## 2024-01-26 DIAGNOSIS — K219 Gastro-esophageal reflux disease without esophagitis: Secondary | ICD-10-CM | POA: Diagnosis not present

## 2024-01-26 DIAGNOSIS — R7989 Other specified abnormal findings of blood chemistry: Secondary | ICD-10-CM

## 2024-01-26 DIAGNOSIS — R140 Abdominal distension (gaseous): Secondary | ICD-10-CM | POA: Diagnosis not present

## 2024-01-26 DIAGNOSIS — K581 Irritable bowel syndrome with constipation: Secondary | ICD-10-CM

## 2024-01-26 DIAGNOSIS — R1013 Epigastric pain: Secondary | ICD-10-CM

## 2024-01-26 DIAGNOSIS — M797 Fibromyalgia: Secondary | ICD-10-CM

## 2024-01-26 DIAGNOSIS — K649 Unspecified hemorrhoids: Secondary | ICD-10-CM

## 2024-01-26 DIAGNOSIS — Z6834 Body mass index (BMI) 34.0-34.9, adult: Secondary | ICD-10-CM

## 2024-01-26 DIAGNOSIS — R1310 Dysphagia, unspecified: Secondary | ICD-10-CM

## 2024-01-26 DIAGNOSIS — K589 Irritable bowel syndrome without diarrhea: Secondary | ICD-10-CM

## 2024-01-26 MED ORDER — OMEPRAZOLE 40 MG PO CPDR
40.0000 mg | DELAYED_RELEASE_CAPSULE | Freq: Every day | ORAL | 3 refills | Status: DC
Start: 2024-01-26 — End: 2024-02-15

## 2024-01-26 NOTE — Patient Instructions (Signed)
 For Reflux: Follow a GERD diet:  Avoid fried, fatty, greasy, spicy, citrus foods. Avoid caffeine and carbonated beverages. Avoid chocolate. Try eating 4-6 small meals a day rather than 3 large meals. Do not eat within 3 hours of laying down. Prop head of bed up on wood or bricks to create a 6 inch incline. I am increasing your omeprazole to 40 mg once daily, 30 minutes prior to breakfast You may use Tums as needed for breakthrough. We are scheduling for an upper endoscopy with possible dilation of your esophagus in the near future.  For IBS and constipation, and bloating: Please start taking Metamucil 1 tablespoon daily and 8 mL's of water For 1 week and when she did take MiraLAX 1 capful daily in 8 ounces of water.  If you have multiple days of diarrhea you can stop. You may use Gas-X 3-4 times a day as needed for burping and excessive gassiness or bloating  Due to your fibromyalgia, constipation, and reflux I will be sending a referral to nutritionist for you.  Please fill out a request form upfront for us  to get your prior colonoscopy records from [place]  and to get your labs and any imaging done by Dr. Eldon Greenland.  Follow-up with me in 3 months, this should be after your upper endoscopy is done.  It was a pleasure to see you today. I want to create trusting relationships with patients. If you receive a survey regarding your visit,  I greatly appreciate you taking time to fill this out on paper or through your MyChart. I value your feedback.  Julian Obey, MSN, FNP-BC, AGACNP-BC [facility]

## 2024-01-26 NOTE — Progress Notes (Signed)
 GI Office Note    Referring Provider: Wyvonna Heidelberg* Primary Care Physician:  [name], [name], MD  Primary Gastroenterologist: Rolando Cliche. Mordechai April, DO  Chief Complaint   Chief Complaint  Patient presents with   Abdominal Pain    Stomach issues, bloating, IBS    History of Present Illness   Emily Vasquez is a 43 y.o. female presenting today at the request of [name], [name]* for abdominal pain.   Per referral paperwork, she has history of hypertension, ADHD, prior alcohol dependence, COPD, tobacco abuse, cervical disc disease, polyneuropathy, anxiety, HLD, GERD, and obesity.  Most recent med list reports Tylenol arthritis, gabapentin, Cymbalta, omeprazole 20 mg daily for GERD, lisinopril-HCTZ, hydroxyzine, tizanidine.  During office visit 3/17 she reported anxiety improved with current treatment plan.  Amatory with assistance due to shakiness and tremor at times.  Working on trying to lose weight.  Most recent labs 01/02/2023: Platelets 275, hematocrit 40.4, hemoglobin 13.9.  Hep B surface antigen nonreactive, hep C antibody nonreactive  Today:  Since 25 has been dealing with IBS. More recently believes her IBS is acting up and her acid reflux is acting up. Can't eat certain things or she vomits. Has fibromyalgia and does not want to take pain medication. Does take lots of Advil and NSAIDs. Gets tightness in the epigastric region and sharp pains and heaviness to her chest also. No family history of celiac. Does not believe she has been tested. Dairy products are bothersome to her and is more irritated. Did nutrition for a year fo school but would be open to working with a nutritionist as more holistic approach rather than medications.Sometimes has dysphagia but relates to her anxiety.  Has asthma as well. Voice has been more hoarse as well. Has had drainage and ears stopped up as well.   Omeprazole does help some and it comes up even if she coughs or breaths  heavy. Because of high stress right now she is trying to do small meals and healthier. Reports both constipation and diarrhea. Sometimes goes 5-6 days without BM but thinks she may have more days of diarrhea than the constipation. Tries to stay away from starches and cheeses as though bind her up.   Recently stopped drinking - occasional use now, was using to help with anxiety previously. Does smoke. Used to be on klonopin and ritalin In the past for anxiety and ADHD. I son disability for her anxiety. Taking muscle relaxers and gabapentin. Admits to high side effects to medications in the past as well.   Reports liver issues since 25 due to medications. States she had workup for this by Dr. Eldon Greenland - 2-3 years ago and he monitors them regularly.   Has had 2 colonoscopies in [place]  and [place] . (6-7 years ago). Not sure if she had EGD.   Had mass on her stomach and did hysterectomy about 5 years ago here. States she is not good with follow ups. Mass was biopsied and reports that was normal.   Follows with neurology.   Has hemorrhoids as well - does have bleeding as well. Only sees the bleeding with straining. Metamucil has worked in the past.   [employer] Readings from Last 3 Encounters:  01/26/24 249 lb 12.8 oz (113.3 kg)  03/11/23 259 lb (117.5 kg)  02/25/23 259 lb (117.5 kg)    Current Outpatient Medications  Medication Sig Dispense Refill   albuterol (PROVENTIL) (2.5 MG/3ML) 0.083% nebulizer solution Take 3 mLs (2.5 mg total) by nebulization every  6 (six) hours as needed for wheezing or shortness of breath. 75 mL 12   albuterol (VENTOLIN HFA) 108 (90 Base) MCG/ACT inhaler Inhale 1-2 puffs into the lungs every 6 (six) hours as needed for wheezing or shortness of breath. 1 each 0   budesonide-formoterol (SYMBICORT) 160-4.5 MCG/ACT inhaler Inhale 2 puffs into the lungs 2 (two) times daily. 1 each 12   DULoxetine (CYMBALTA) 30 MG capsule Take 30 mg by mouth 2 (two) times daily.     gabapentin  (NEURONTIN) 300 MG capsule Take 300 mg by mouth daily.     hydrOXYzine (ATARAX/VISTARIL) 25 MG tablet Take 1 tablet (25 mg total) by mouth every 6 (six) hours as needed for itching. 20 tablet 0   levocetirizine (XYZAL) 5 MG tablet Take 1 tablet (5 mg total) by mouth every evening. 30 tablet 1   lisinopril-hydrochlorothiazide (ZESTORETIC) 10-12.5 MG tablet Take 1 tablet by mouth every morning.     omeprazole (PRILOSEC) 40 MG capsule Take 1 capsule (40 mg total) by mouth daily. 90 capsule 3   sertraline (ZOLOFT) 100 MG tablet Take 100 mg by mouth daily.     tizanidine (ZANAFLEX) 2 MG capsule Take 2 mg by mouth daily.     No current facility-administered medications for this visit.    Past Medical History:  Diagnosis Date   ADD (attention deficit disorder)    Anxiety    Asthma    Degenerative disc disease, cervical    Fibromyalgia    GERD (gastroesophageal reflux disease)    Low back pain    Ovarian cyst    Ovarian torsion     Past Surgical History:  Procedure Laterality Date   ABDOMINAL HYSTERECTOMY Left 06/17/2017   Procedure: HYSTERECTOMY ABDOMINAL; LEFT SALPINGO OOPHORECTOMY;  Surgeon: Wendelyn Halter, MD;  Location: [facility];  Service: Gynecology;  Laterality: Left;   CESAREAN SECTION     HAND SURGERY     LAPAROSCOPIC SALPINGO OOPHERECTOMY Left 06/17/2017   Procedure: diagnostic laproscopy;  Surgeon: Wendelyn Halter, MD;  Location: [facility];  Service: Gynecology;  Laterality: Left;  decision to open at 1330   OOPHORECTOMY     TUBAL LIGATION      Family History  Problem Relation Age of Onset   Alcohol abuse Paternal Grandfather    Mental illness Maternal Grandmother    Diabetes Maternal Grandmother    Heart attack Maternal Grandfather    Heart attack Father    High Cholesterol Father    Diabetes Brother    Stroke Brother    Asthma Daughter    Asthma Son     Allergies as of 01/26/2024 - Review Complete 01/26/2024  Allergen Reaction Noted   Compazine [prochlorperazine  maleate] Other (See Comments) 05/24/2017    Social History   Socioeconomic History   Marital status: Single    Spouse name: Not on file   Number of children: Not on file   Years of education: Not on file   Highest education level: Not on file  Occupational History   Not on file  Tobacco Use   Smoking status: Every Day    Current packs/day: 0.75    Average packs/day: 0.8 packs/day for 26.0 years (19.5 ttl pk-yrs)    Types: Cigarettes   Smokeless tobacco: Never  Vaping Use   Vaping status: Never Used  Substance and Sexual Activity   Alcohol use: Yes    Comment: occasional   Drug use: No   Sexual activity: Not Currently    Birth  control/protection: Surgical    Comment: tubal  Other Topics Concern   Not on file  Social History Narrative   Not on file   Social Drivers of Health   Financial Resource Strain: Not on file  Food Insecurity: Not on file  Transportation Needs: Not on file  Physical Activity: Not on file  Stress: Not on file  Social Connections: Not on file  Intimate Partner Violence: Not on file   Review of Systems   Gen: Denies any fever, chills, fatigue, weight loss, lack of appetite.  CV: Denies chest pain, heart palpitations, peripheral edema, syncope.  Resp: Denies shortness of breath at rest or with exertion. Denies wheezing or cough.  GI: see HPI GU : Denies urinary burning, urinary frequency, urinary hesitancy MS: + joint and muscle pain. Denies cramps, or limitation of movement.  Derm: Denies rash, itching, dry skin Psych: +anxiety. Denies depression, memory loss, and confusion Heme: Denies bruising, bleeding, and enlarged lymph nodes.  Physical Exam   BP 137/87 (BP Location: Right Arm, Patient Position: Sitting, Cuff Size: Large)   Pulse 87   Temp 97.7 F (36.5 C) (Temporal)   Ht 5\' 11"  (1.803 m)   Wt 249 lb 12.8 oz (113.3 kg)   LMP 05/12/2017   BMI 34.84 kg/m   General:   Alert and oriented. Pleasant and cooperative. Well-nourished  and well-developed.  Head:  Normocephalic and atraumatic. Eyes:  Without icterus, sclera clear and conjunctiva pink.  Ears:  Normal auditory acuity.  Abdomen:  +BS, soft, non-distended. Lower abdominal tenderness to palpation. No HSM noted. No guarding or rebound. No masses appreciated.  Rectal:  Deferred  Msk:  Symmetrical without gross deformities. Normal posture. Extremities:  Without edema. Neurologic:  Alert and  oriented x4;  grossly normal neurologically. Skin:  Intact without significant lesions or rashes. Psych:  Alert and cooperative. Normal mood and affect.  Assessment   Emily Vasquez is a 43 y.o. female with a history of anxiety, GERD, ADHD, HTN, tobacco and alcohol dependence, COPD presenting today with GERD, epigastric pain, bloating, and constipation.   Elevated LFTs: - Prior alcohol use likely contributing - AST 63 and ALT 135 in 2022.  Requesting updated LFTs and any other workup by Dr. Eldon Greenland -Likely needs imaging if not performed recently  Epigastric pain, bloating, GERD: - has certain dietary triggers including dairy - admits to frequent NSAID use - symptoms include belching, voice hoarseness, bloating, nausea, chest discomfort, dysphagia - has been taking low dose omeprazole which helps some -Will perform EGD to further evaluate symptoms and possible dilation for dysphagia - Discussed nutrition consult to help with weight loss and chronic conditions -Increasing PPI dose, encouraged GERD diet, use tums as needed  IBS: - Although she reports alternating constipation and diarrhea, I feel as though diarrhea is likely overflow -It is most likely constipation predominant given associated bloating and abdominal pain - Anxiety likely contributing.  - Goes up to 5-6 days without BM -Advised to start metamucil daily and trial miralax daily for 1 week -Advised Gas-x as needed for bloating  Hemorrhoids: bleeding occurs with hard BMs and straining.   PLAN    Nutritionist referral.  Increase omeprazole to 40 mg once daily.  Tums as needed for breakthrough GERD diet Proceed with upper endoscopy with possible dilation with propofol by Dr. Mordechai April in near future: the risks, benefits, and alternatives have been discussed with the patient in detail. The patient states understanding and desires to proceed. ASA 3 Metamucil 1 tablespoon daily  Miralax daily for 1 week Gas ex as needed Request prior colonoscopy reports and labs from Dr. Eldon Greenland Likely will need imaging of liver given nothing available in the system for evaluation of elevated LFTs Follow up in 3 months.    Julian Obey, MSN, FNP-BC, AGACNP-BC [facility]

## 2024-01-26 NOTE — H&P (View-Only) (Signed)
 GI Office Note    Referring Provider: Wyvonna Heidelberg* Primary Care Physician:  [name], [name], MD  Primary Gastroenterologist: Rolando Cliche. Mordechai April, DO  Chief Complaint   Chief Complaint  Patient presents with   Abdominal Pain    Stomach issues, bloating, IBS    History of Present Illness   Emily Vasquez is a 43 y.o. female presenting today at the request of [name], [name]* for abdominal pain.   Per referral paperwork, she has history of hypertension, ADHD, prior alcohol dependence, COPD, tobacco abuse, cervical disc disease, polyneuropathy, anxiety, HLD, GERD, and obesity.  Most recent med list reports Tylenol arthritis, gabapentin, Cymbalta, omeprazole 20 mg daily for GERD, lisinopril-HCTZ, hydroxyzine, tizanidine.  During office visit 3/17 she reported anxiety improved with current treatment plan.  Amatory with assistance due to shakiness and tremor at times.  Working on trying to lose weight.  Most recent labs 01/02/2023: Platelets 275, hematocrit 40.4, hemoglobin 13.9.  Hep B surface antigen nonreactive, hep C antibody nonreactive  Today:  Since 25 has been dealing with IBS. More recently believes her IBS is acting up and her acid reflux is acting up. Can't eat certain things or she vomits. Has fibromyalgia and does not want to take pain medication. Does take lots of Advil and NSAIDs. Gets tightness in the epigastric region and sharp pains and heaviness to her chest also. No family history of celiac. Does not believe she has been tested. Dairy products are bothersome to her and is more irritated. Did nutrition for a year fo school but would be open to working with a nutritionist as more holistic approach rather than medications.Sometimes has dysphagia but relates to her anxiety.  Has asthma as well. Voice has been more hoarse as well. Has had drainage and ears stopped up as well.   Omeprazole does help some and it comes up even if she coughs or breaths  heavy. Because of high stress right now she is trying to do small meals and healthier. Reports both constipation and diarrhea. Sometimes goes 5-6 days without BM but thinks she may have more days of diarrhea than the constipation. Tries to stay away from starches and cheeses as though bind her up.   Recently stopped drinking - occasional use now, was using to help with anxiety previously. Does smoke. Used to be on klonopin and ritalin In the past for anxiety and ADHD. I son disability for her anxiety. Taking muscle relaxers and gabapentin. Admits to high side effects to medications in the past as well.   Reports liver issues since 25 due to medications. States she had workup for this by Dr. Eldon Greenland - 2-3 years ago and he monitors them regularly.   Has had 2 colonoscopies in [place]  and [place] . (6-7 years ago). Not sure if she had EGD.   Had mass on her stomach and did hysterectomy about 5 years ago here. States she is not good with follow ups. Mass was biopsied and reports that was normal.   Follows with neurology.   Has hemorrhoids as well - does have bleeding as well. Only sees the bleeding with straining. Metamucil has worked in the past.   [employer] Readings from Last 3 Encounters:  01/26/24 249 lb 12.8 oz (113.3 kg)  03/11/23 259 lb (117.5 kg)  02/25/23 259 lb (117.5 kg)    Current Outpatient Medications  Medication Sig Dispense Refill   albuterol (PROVENTIL) (2.5 MG/3ML) 0.083% nebulizer solution Take 3 mLs (2.5 mg total) by nebulization every  6 (six) hours as needed for wheezing or shortness of breath. 75 mL 12   albuterol (VENTOLIN HFA) 108 (90 Base) MCG/ACT inhaler Inhale 1-2 puffs into the lungs every 6 (six) hours as needed for wheezing or shortness of breath. 1 each 0   budesonide-formoterol (SYMBICORT) 160-4.5 MCG/ACT inhaler Inhale 2 puffs into the lungs 2 (two) times daily. 1 each 12   DULoxetine (CYMBALTA) 30 MG capsule Take 30 mg by mouth 2 (two) times daily.     gabapentin  (NEURONTIN) 300 MG capsule Take 300 mg by mouth daily.     hydrOXYzine (ATARAX/VISTARIL) 25 MG tablet Take 1 tablet (25 mg total) by mouth every 6 (six) hours as needed for itching. 20 tablet 0   levocetirizine (XYZAL) 5 MG tablet Take 1 tablet (5 mg total) by mouth every evening. 30 tablet 1   lisinopril-hydrochlorothiazide (ZESTORETIC) 10-12.5 MG tablet Take 1 tablet by mouth every morning.     omeprazole (PRILOSEC) 40 MG capsule Take 1 capsule (40 mg total) by mouth daily. 90 capsule 3   sertraline (ZOLOFT) 100 MG tablet Take 100 mg by mouth daily.     tizanidine (ZANAFLEX) 2 MG capsule Take 2 mg by mouth daily.     No current facility-administered medications for this visit.    Past Medical History:  Diagnosis Date   ADD (attention deficit disorder)    Anxiety    Asthma    Degenerative disc disease, cervical    Fibromyalgia    GERD (gastroesophageal reflux disease)    Low back pain    Ovarian cyst    Ovarian torsion     Past Surgical History:  Procedure Laterality Date   ABDOMINAL HYSTERECTOMY Left 06/17/2017   Procedure: HYSTERECTOMY ABDOMINAL; LEFT SALPINGO OOPHORECTOMY;  Surgeon: Wendelyn Halter, MD;  Location: [facility];  Service: Gynecology;  Laterality: Left;   CESAREAN SECTION     HAND SURGERY     LAPAROSCOPIC SALPINGO OOPHERECTOMY Left 06/17/2017   Procedure: diagnostic laproscopy;  Surgeon: Wendelyn Halter, MD;  Location: [facility];  Service: Gynecology;  Laterality: Left;  decision to open at 1330   OOPHORECTOMY     TUBAL LIGATION      Family History  Problem Relation Age of Onset   Alcohol abuse Paternal Grandfather    Mental illness Maternal Grandmother    Diabetes Maternal Grandmother    Heart attack Maternal Grandfather    Heart attack Father    High Cholesterol Father    Diabetes Brother    Stroke Brother    Asthma Daughter    Asthma Son     Allergies as of 01/26/2024 - Review Complete 01/26/2024  Allergen Reaction Noted   Compazine [prochlorperazine  maleate] Other (See Comments) 05/24/2017    Social History   Socioeconomic History   Marital status: Single    Spouse name: Not on file   Number of children: Not on file   Years of education: Not on file   Highest education level: Not on file  Occupational History   Not on file  Tobacco Use   Smoking status: Every Day    Current packs/day: 0.75    Average packs/day: 0.8 packs/day for 26.0 years (19.5 ttl pk-yrs)    Types: Cigarettes   Smokeless tobacco: Never  Vaping Use   Vaping status: Never Used  Substance and Sexual Activity   Alcohol use: Yes    Comment: occasional   Drug use: No   Sexual activity: Not Currently    Birth  control/protection: Surgical    Comment: tubal  Other Topics Concern   Not on file  Social History Narrative   Not on file   Social Drivers of Health   Financial Resource Strain: Not on file  Food Insecurity: Not on file  Transportation Needs: Not on file  Physical Activity: Not on file  Stress: Not on file  Social Connections: Not on file  Intimate Partner Violence: Not on file   Review of Systems   Gen: Denies any fever, chills, fatigue, weight loss, lack of appetite.  CV: Denies chest pain, heart palpitations, peripheral edema, syncope.  Resp: Denies shortness of breath at rest or with exertion. Denies wheezing or cough.  GI: see HPI GU : Denies urinary burning, urinary frequency, urinary hesitancy MS: + joint and muscle pain. Denies cramps, or limitation of movement.  Derm: Denies rash, itching, dry skin Psych: +anxiety. Denies depression, memory loss, and confusion Heme: Denies bruising, bleeding, and enlarged lymph nodes.  Physical Exam   BP 137/87 (BP Location: Right Arm, Patient Position: Sitting, Cuff Size: Large)   Pulse 87   Temp 97.7 F (36.5 C) (Temporal)   Ht 5\' 11"  (1.803 m)   Wt 249 lb 12.8 oz (113.3 kg)   LMP 05/12/2017   BMI 34.84 kg/m   General:   Alert and oriented. Pleasant and cooperative. Well-nourished  and well-developed.  Head:  Normocephalic and atraumatic. Eyes:  Without icterus, sclera clear and conjunctiva pink.  Ears:  Normal auditory acuity.  Abdomen:  +BS, soft, non-distended. Lower abdominal tenderness to palpation. No HSM noted. No guarding or rebound. No masses appreciated.  Rectal:  Deferred  Msk:  Symmetrical without gross deformities. Normal posture. Extremities:  Without edema. Neurologic:  Alert and  oriented x4;  grossly normal neurologically. Skin:  Intact without significant lesions or rashes. Psych:  Alert and cooperative. Normal mood and affect.  Assessment   Emily Vasquez is a 43 y.o. female with a history of anxiety, GERD, ADHD, HTN, tobacco and alcohol dependence, COPD presenting today with GERD, epigastric pain, bloating, and constipation.   Elevated LFTs: - Prior alcohol use likely contributing - AST 63 and ALT 135 in 2022.  Requesting updated LFTs and any other workup by Dr. Eldon Greenland -Likely needs imaging if not performed recently  Epigastric pain, bloating, GERD: - has certain dietary triggers including dairy - admits to frequent NSAID use - symptoms include belching, voice hoarseness, bloating, nausea, chest discomfort, dysphagia - has been taking low dose omeprazole which helps some -Will perform EGD to further evaluate symptoms and possible dilation for dysphagia - Discussed nutrition consult to help with weight loss and chronic conditions -Increasing PPI dose, encouraged GERD diet, use tums as needed  IBS: - Although she reports alternating constipation and diarrhea, I feel as though diarrhea is likely overflow -It is most likely constipation predominant given associated bloating and abdominal pain - Anxiety likely contributing.  - Goes up to 5-6 days without BM -Advised to start metamucil daily and trial miralax daily for 1 week -Advised Gas-x as needed for bloating  Hemorrhoids: bleeding occurs with hard BMs and straining.   PLAN    Nutritionist referral.  Increase omeprazole to 40 mg once daily.  Tums as needed for breakthrough GERD diet Proceed with upper endoscopy with possible dilation with propofol by Dr. Mordechai April in near future: the risks, benefits, and alternatives have been discussed with the patient in detail. The patient states understanding and desires to proceed. ASA 3 Metamucil 1 tablespoon daily  Miralax daily for 1 week Gas ex as needed Request prior colonoscopy reports and labs from Dr. Eldon Greenland Likely will need imaging of liver given nothing available in the system for evaluation of elevated LFTs Follow up in 3 months.    Julian Obey, MSN, FNP-BC, AGACNP-BC [facility]

## 2024-01-26 NOTE — Telephone Encounter (Signed)
 Notification or Prior Authorization is not required for the requested services You are not required to submit a notification/prior authorization based on the information provided. If you have general questions about the prior authorization requirements, visit [email or web address] > Clinician Resources > Advance and Admission Notification Requirements. The number above acknowledges your notification. Please write this reference number down for future reference. If you would like to request an organization determination, please call us  at [phone]. Decision ID #: [id number]

## 2024-02-11 ENCOUNTER — Encounter (HOSPITAL_COMMUNITY)
Admission: RE | Admit: 2024-02-11 | Discharge: 2024-02-11 | Disposition: A | Discharge status: Home / Self Care | Source: Ambulatory Visit | Attending: Internal Medicine | Admitting: Internal Medicine

## 2024-02-15 ENCOUNTER — Ambulatory Visit (HOSPITAL_COMMUNITY): Admitting: Anesthesiology

## 2024-02-15 ENCOUNTER — Other Ambulatory Visit: Payer: Self-pay

## 2024-02-15 ENCOUNTER — Encounter (HOSPITAL_COMMUNITY): Payer: Self-pay | Admitting: Internal Medicine

## 2024-02-15 ENCOUNTER — Encounter (HOSPITAL_COMMUNITY)
Admission: RE | Disposition: A | Discharge status: Home / Self Care | Payer: Self-pay | Source: Home / Self Care | Attending: Internal Medicine

## 2024-02-15 ENCOUNTER — Ambulatory Visit (HOSPITAL_COMMUNITY)
Admission: RE | Admit: 2024-02-15 | Discharge: 2024-02-15 | Disposition: A | Discharge status: Home / Self Care | Attending: Internal Medicine | Admitting: Internal Medicine

## 2024-02-15 DIAGNOSIS — I10 Essential (primary) hypertension: Secondary | ICD-10-CM | POA: Insufficient documentation

## 2024-02-15 DIAGNOSIS — K222 Esophageal obstruction: Secondary | ICD-10-CM

## 2024-02-15 DIAGNOSIS — M797 Fibromyalgia: Secondary | ICD-10-CM | POA: Diagnosis not present

## 2024-02-15 DIAGNOSIS — T50905A Adverse effect of unspecified drugs, medicaments and biological substances, initial encounter: Secondary | ICD-10-CM | POA: Insufficient documentation

## 2024-02-15 DIAGNOSIS — F17210 Nicotine dependence, cigarettes, uncomplicated: Secondary | ICD-10-CM | POA: Insufficient documentation

## 2024-02-15 DIAGNOSIS — K581 Irritable bowel syndrome with constipation: Secondary | ICD-10-CM | POA: Insufficient documentation

## 2024-02-15 DIAGNOSIS — Z79899 Other long term (current) drug therapy: Secondary | ICD-10-CM | POA: Diagnosis not present

## 2024-02-15 DIAGNOSIS — K2970 Gastritis, unspecified, without bleeding: Secondary | ICD-10-CM | POA: Insufficient documentation

## 2024-02-15 DIAGNOSIS — K219 Gastro-esophageal reflux disease without esophagitis: Secondary | ICD-10-CM | POA: Diagnosis not present

## 2024-02-15 DIAGNOSIS — K449 Diaphragmatic hernia without obstruction or gangrene: Secondary | ICD-10-CM | POA: Diagnosis not present

## 2024-02-15 DIAGNOSIS — K769 Liver disease, unspecified: Secondary | ICD-10-CM | POA: Diagnosis not present

## 2024-02-15 DIAGNOSIS — R1013 Epigastric pain: Secondary | ICD-10-CM | POA: Diagnosis present

## 2024-02-15 DIAGNOSIS — Z6833 Body mass index (BMI) 33.0-33.9, adult: Secondary | ICD-10-CM | POA: Diagnosis not present

## 2024-02-15 DIAGNOSIS — K2950 Unspecified chronic gastritis without bleeding: Secondary | ICD-10-CM | POA: Diagnosis not present

## 2024-02-15 DIAGNOSIS — J4489 Other specified chronic obstructive pulmonary disease: Secondary | ICD-10-CM | POA: Insufficient documentation

## 2024-02-15 DIAGNOSIS — K319 Disease of stomach and duodenum, unspecified: Secondary | ICD-10-CM | POA: Diagnosis not present

## 2024-02-15 DIAGNOSIS — F1021 Alcohol dependence, in remission: Secondary | ICD-10-CM | POA: Insufficient documentation

## 2024-02-15 DIAGNOSIS — F419 Anxiety disorder, unspecified: Secondary | ICD-10-CM | POA: Diagnosis not present

## 2024-02-15 DIAGNOSIS — R140 Abdominal distension (gaseous): Secondary | ICD-10-CM | POA: Insufficient documentation

## 2024-02-15 DIAGNOSIS — Z90710 Acquired absence of both cervix and uterus: Secondary | ICD-10-CM | POA: Insufficient documentation

## 2024-02-15 DIAGNOSIS — E669 Obesity, unspecified: Secondary | ICD-10-CM | POA: Diagnosis not present

## 2024-02-15 DIAGNOSIS — R1310 Dysphagia, unspecified: Secondary | ICD-10-CM | POA: Diagnosis not present

## 2024-02-15 DIAGNOSIS — K649 Unspecified hemorrhoids: Secondary | ICD-10-CM | POA: Diagnosis not present

## 2024-02-15 DIAGNOSIS — R7989 Other specified abnormal findings of blood chemistry: Secondary | ICD-10-CM | POA: Insufficient documentation

## 2024-02-15 HISTORY — PX: ESOPHAGEAL DILATION: SHX303

## 2024-02-15 HISTORY — PX: ESOPHAGOGASTRODUODENOSCOPY: SHX5428

## 2024-02-15 SURGERY — EGD (ESOPHAGOGASTRODUODENOSCOPY)
Anesthesia: General

## 2024-02-15 MED ORDER — PROPOFOL 500 MG/50ML IV EMUL
INTRAVENOUS | Status: DC | PRN
  Administered 2024-02-15: 175 ug/kg/min via INTRAVENOUS

## 2024-02-15 MED ORDER — PROPOFOL 10 MG/ML IV BOLUS
INTRAVENOUS | Status: DC | PRN
  Administered 2024-02-15: 70 mg via INTRAVENOUS

## 2024-02-15 MED ORDER — OMEPRAZOLE 40 MG PO CPDR: 40.0000 mg | DELAYED_RELEASE_CAPSULE | Freq: Two times a day (BID) | ORAL | 11 refills | Status: DC

## 2024-02-15 MED ORDER — LIDOCAINE 2% (20 MG/ML) 5 ML SYRINGE
INTRAMUSCULAR | Status: DC | PRN
  Administered 2024-02-15: 50 mg via INTRAVENOUS

## 2024-02-15 MED ORDER — LACTATED RINGERS IV SOLN: INTRAVENOUS | Status: DC | PRN

## 2024-02-15 MED ORDER — LACTATED RINGERS IV SOLN: INTRAVENOUS | Status: DC

## 2024-02-15 NOTE — Op Note (Signed)
 [facility] Patient Name: Emily Vasquez Procedure Date: 02/15/2024 9:18 AM MRN: [id number] Date of Birth: 1980/12/21 Attending MD: Rolando Cliche. Mordechai April , [place], [id number] CSN: [id number] Age: 43 Admit Type: Outpatient Procedure:                Upper GI endoscopy Indications:              Epigastric abdominal pain, Dysphagia, Heartburn Providers:                Rolando Cliche. Mordechai April, DO, Graydon Lazier RN, RN, Kimball Pence Tech, Technician Referring MD:              Medicines:                See the Anesthesia note for documentation of the                            administered medications Complications:            No immediate complications. Estimated Blood Loss:     Estimated blood loss was minimal. Procedure:                Pre-Anesthesia Assessment:                           - The anesthesia plan was to use monitored                            anesthesia care (MAC).                           After obtaining informed consent, the endoscope was                            passed under direct vision. Throughout the                            procedure, the patient's blood pressure, pulse, and                            oxygen saturations were monitored continuously. The                            GIF-H190 ([id number]) scope was introduced through the                            mouth, and advanced to the second part of duodenum.                            The upper GI endoscopy was accomplished without                            difficulty. The patient tolerated the procedure  well. Scope In: 9:45:52 AM Scope Out: 9:51:26 AM Total Procedure Duration: 0 hours 5 minutes 34 seconds  Findings:      A small hiatal hernia was present.      A mild Schatzki ring was found in the distal esophagus. A TTS dilator       was passed through the scope. Dilation with an 18-19-20 mm balloon       dilator was performed to 20 mm. The dilation site was  examined and       showed moderate improvement in luminal narrowing. Ring then further       disrupted with cold forceps.      Patchy moderate inflammation characterized by erosions and erythema was       found in the entire examined stomach. Biopsies were taken with a cold       forceps for Helicobacter pylori testing.      The duodenal bulb, first portion of the duodenum and second portion of       the duodenum were normal. Impression:               - Small hiatal hernia.                           - Mild Schatzki ring. Dilated.                           - Gastritis. Biopsied.                           - Normal duodenal bulb, first portion of the                            duodenum and second portion of the duodenum. Moderate Sedation:      Per Anesthesia Care Recommendation:           - Patient has a contact number available for                            emergencies. The signs and symptoms of potential                            delayed complications were discussed with the                            patient. Return to normal activities tomorrow.                            Written discharge instructions were provided to the                            patient.                           - Resume previous diet.                           - Continue present medications.                           -  Await pathology results.                           - Repeat upper endoscopy PRN for retreatment.                           - Return to GI clinic in 6 weeks. Procedure Code(s):        --- Professional ---                           [phone], Esophagogastroduodenoscopy, flexible,                            transoral; with transendoscopic balloon dilation of                            esophagus (less than 30 mm diameter)                           43239, 59, Esophagogastroduodenoscopy, flexible,                            transoral; with biopsy, single or multiple Diagnosis Code(s):        ---  Professional ---                           K44.9, Diaphragmatic hernia without obstruction or                            gangrene                           K22.2, Esophageal obstruction                           K29.70, Gastritis, unspecified, without bleeding                           R10.13, Epigastric pain                           R13.10, Dysphagia, unspecified                           R12, Heartburn CPT copyright 2022 American Medical Association. All rights reserved. The codes documented in this report are preliminary and upon coder review may  be revised to meet current compliance requirements. Rolando Cliche. Mordechai April, DO Rolando Cliche. Danique Hartsough, DO 02/15/2024 9:55:01 AM This report has been signed electronically. Number of Addenda: 0

## 2024-02-15 NOTE — Interval H&P Note (Signed)
 History and Physical Interval Note:  02/15/2024 8:57 AM  Emily Vasquez  has presented today for surgery, with the diagnosis of gerd, epigastric pain, dysphagia.  The various methods of treatment have been discussed with the patient and family. After consideration of risks, benefits and other options for treatment, the patient has consented to  Procedure(s) with comments: EGD (ESOPHAGOGASTRODUODENOSCOPY) (N/A) - 930am, asa 3 DILATION, ESOPHAGUS (N/A) as a surgical intervention.  The patient's history has been reviewed, patient examined, no change in status, stable for surgery.  I have reviewed the patient's chart and labs.  Questions were answered to the patient's satisfaction.     Vinetta Greening

## 2024-02-15 NOTE — Transfer of Care (Signed)
 Immediate Anesthesia Transfer of Care Note  Patient: Emily Vasquez  Procedure(s) Performed: EGD (ESOPHAGOGASTRODUODENOSCOPY) DILATION, ESOPHAGUS  Patient Location: PACU  Anesthesia Type:General  Level of Consciousness: awake, alert , and oriented  Airway & Oxygen Therapy: Patient Spontanous Breathing  Post-op Assessment: Report given to RN and Post -op Vital signs reviewed and stable  Post vital signs: Reviewed and stable  Last Vitals:  Vitals Value Taken Time  BP 119/95 02/15/24 1000  Temp    Pulse 74 02/15/24 1002  Resp 22 02/15/24 1002  SpO2 98 % 02/15/24 1002  Vitals shown include unfiled device data.  Last Pain:  Vitals:   02/15/24 0937  TempSrc:   PainSc: 6          Complications: No notable events documented.

## 2024-02-15 NOTE — Anesthesia Preprocedure Evaluation (Signed)
 Anesthesia Evaluation  Patient identified by MRN, date of birth, ID band Patient awake    Reviewed: Allergy & Precautions, H&P , NPO status , Patient's Chart, lab work & pertinent test results, reviewed documented beta blocker date and time   Airway Mallampati: II  TM Distance: >3 FB Neck ROM: full    Dental no notable dental hx.    Pulmonary neg pulmonary ROS, asthma , Current Smoker and Patient abstained from smoking.   Pulmonary exam normal breath sounds clear to auscultation       Cardiovascular Exercise Tolerance: Good hypertension, negative cardio ROS  Rhythm:regular Rate:Normal     Neuro/Psych  PSYCHIATRIC DISORDERS Anxiety      Neuromuscular disease negative neurological ROS  negative psych ROS   GI/Hepatic negative GI ROS, Neg liver ROS,GERD  ,,  Endo/Other  negative endocrine ROS    Renal/GU negative Renal ROS  negative genitourinary   Musculoskeletal   Abdominal   Peds  Hematology negative hematology ROS (+)   Anesthesia Other Findings   Reproductive/Obstetrics negative OB ROS                             Anesthesia Physical Anesthesia Plan  ASA: 2  Anesthesia Plan: General   Post-op Pain Management:    Induction:   PONV Risk Score and Plan: Propofol  infusion  Airway Management Planned:   Additional Equipment:   Intra-op Plan:   Post-operative Plan:   Informed Consent: I have reviewed the patients History and Physical, chart, labs and discussed the procedure including the risks, benefits and alternatives for the proposed anesthesia with the patient or authorized representative who has indicated his/her understanding and acceptance.     Dental Advisory Given  Plan Discussed with: CRNA  Anesthesia Plan Comments:        Anesthesia Quick Evaluation

## 2024-02-15 NOTE — Discharge Instructions (Addendum)
 EGD Discharge instructions Please read the instructions outlined below and refer to this sheet in the next few weeks. These discharge instructions provide you with general information on caring for yourself after you leave the hospital. Your doctor may also give you specific instructions. While your treatment has been planned according to the most current medical practices available, unavoidable complications occasionally occur. If you have any problems or questions after discharge, please call your doctor. ACTIVITY You may resume your regular activity but move at a slower pace for the next 24 hours.  Take frequent rest periods for the next 24 hours.  Walking will help expel (get rid of) the air and reduce the bloated feeling in your abdomen.  No driving for 24 hours (because of the anesthesia (medicine) used during the test).  You may shower.  Do not sign any important legal documents or operate any machinery for 24 hours (because of the anesthesia used during the test).  NUTRITION Drink plenty of fluids.  You may resume your normal diet.  Begin with a light meal and progress to your normal diet.  Avoid alcoholic beverages for 24 hours or as instructed by your caregiver.  MEDICATIONS You may resume your normal medications unless your caregiver tells you otherwise.  WHAT YOU CAN EXPECT TODAY You may experience abdominal discomfort such as a feeling of fullness or "gas" pains.  FOLLOW-UP Your doctor will discuss the results of your test with you.  SEEK IMMEDIATE MEDICAL ATTENTION IF ANY OF THE FOLLOWING OCCUR: Excessive nausea (feeling sick to your stomach) and/or vomiting.  Severe abdominal pain and distention (swelling).  Trouble swallowing.  Temperature over 101 F (37.8 C).  Rectal bleeding or vomiting of blood.   Your EGD revealed moderate amount inflammation in your stomach.  I took biopsies of this to rule out infection with a bacteria called H. pylori.  Await pathology results, my  office will contact you.  Small bowel was normal.  You also have a small hiatal hernia as well as a tightening of your esophagus called a Schatzki's ring.  I stretched this out today.  Hopefully this helps with the feeling of food being stuck.  I am going to increase your omeprazole  to twice daily for the next 12 weeks.  Follow-up in GI office in 6 to 8 weeks.  I hope you have a great rest of your week!  Rolando Cliche. Mordechai April, D.O. Gastroenterology and Hepatology [facility]

## 2024-02-16 ENCOUNTER — Encounter (HOSPITAL_COMMUNITY): Payer: Self-pay | Admitting: Internal Medicine

## 2024-02-16 LAB — SURGICAL PATHOLOGY

## 2024-02-16 NOTE — Anesthesia Postprocedure Evaluation (Signed)
 Anesthesia Post Note  Patient: [name]  Procedure(s) Performed: EGD (ESOPHAGOGASTRODUODENOSCOPY) DILATION, ESOPHAGUS  Patient location during evaluation: Phase II Anesthesia Type: General Level of consciousness: awake Pain management: pain level controlled Vital Signs Assessment: post-procedure vital signs reviewed and stable Respiratory status: spontaneous breathing and respiratory function stable Cardiovascular status: blood pressure returned to baseline and stable Postop Assessment: no headache and no apparent nausea or vomiting Anesthetic complications: no Comments: Late entry   No notable events documented.   Last Vitals:  Vitals:   02/15/24 0830 02/15/24 0959  BP: 130/87 (!) 119/95  Pulse: 76 74  Resp: 13 20  Temp: 36.8 C 36.6 C  SpO2: 98% 99%    Last Pain:  Vitals:   02/15/24 0959  TempSrc: Oral  PainSc: 0-No pain                 Coretha Dew

## 2024-02-23 ENCOUNTER — Encounter: Payer: Self-pay | Admitting: Gastroenterology

## 2024-02-23 ENCOUNTER — Telehealth: Payer: Self-pay | Admitting: Gastroenterology

## 2024-02-23 NOTE — Telephone Encounter (Signed)
 Received labs from Dr. Ronnald Coil office:  AST 46 ALT 54 T. bili 0.6 Platelets 284 Albumin 4.5 Sodium 137 Hep C antibody non-reactive B surface antigen negative Alk phos 82  Hemoglobin 14.6 Glucose 101  No imaging reports sent.   Recommend completing at least a right upper quadrant ultrasound and depending on its results potentially performing additional serologies for further workup of the liver enzymes.  Please reach out to patient to see if she is willing to proceed with ultrasound.  Julian Obey, MSN, APRN, FNP-BC, AGACNP-BC [facility]

## 2024-02-23 NOTE — Telephone Encounter (Signed)
LMOM for pt to call office. Also sent a MyChart message.

## 2024-02-24 ENCOUNTER — Other Ambulatory Visit: Payer: Self-pay | Admitting: Gastroenterology

## 2024-02-24 DIAGNOSIS — R7989 Other specified abnormal findings of blood chemistry: Secondary | ICD-10-CM

## 2024-02-25 NOTE — Telephone Encounter (Signed)
 Please schedule patient for RUQ US  elastography - elevated LFTs

## 2024-03-09 ENCOUNTER — Encounter: Payer: Self-pay | Admitting: Gastroenterology

## 2024-03-09 ENCOUNTER — Ambulatory Visit (HOSPITAL_COMMUNITY)
Admission: RE | Admit: 2024-03-09 | Discharge: 2024-03-09 | Disposition: A | Discharge status: Home / Self Care | Source: Ambulatory Visit | Attending: Gastroenterology | Admitting: Gastroenterology

## 2024-03-09 ENCOUNTER — Ambulatory Visit: Payer: Self-pay | Admitting: Gastroenterology

## 2024-03-09 ENCOUNTER — Other Ambulatory Visit: Payer: Self-pay | Admitting: Gastroenterology

## 2024-03-09 DIAGNOSIS — R7989 Other specified abnormal findings of blood chemistry: Secondary | ICD-10-CM

## 2024-03-25 ENCOUNTER — Encounter (INDEPENDENT_AMBULATORY_CARE_PROVIDER_SITE_OTHER): Payer: Self-pay

## 2024-03-28 ENCOUNTER — Ambulatory Visit: Admitting: Gastroenterology

## 2024-03-31 ENCOUNTER — Telehealth: Payer: Self-pay | Admitting: *Deleted

## 2024-03-31 NOTE — Telephone Encounter (Signed)
 Received a phone call from [facility], they stated they do not have any medical records for pt.

## 2024-04-19 ENCOUNTER — Encounter: Payer: Self-pay | Admitting: Gastroenterology

## 2024-04-19 ENCOUNTER — Ambulatory Visit: Admitting: Gastroenterology

## 2024-05-17 ENCOUNTER — Telehealth: Payer: Self-pay | Admitting: Internal Medicine

## 2024-05-17 NOTE — Telephone Encounter (Signed)
 Sent request for medical records twice and as of 8/5 never received any records or notification that there were no records.

## 2024-08-23 ENCOUNTER — Telehealth: Payer: Self-pay | Admitting: Gastroenterology

## 2024-08-23 ENCOUNTER — Encounter: Payer: Self-pay | Admitting: Gastroenterology

## 2024-08-23 ENCOUNTER — Ambulatory Visit (INDEPENDENT_AMBULATORY_CARE_PROVIDER_SITE_OTHER): Admitting: Gastroenterology

## 2024-08-23 VITALS — BP 117/85 | HR 89 | Temp 98.0°F | Ht 71.0 in | Wt 277.0 lb

## 2024-08-23 DIAGNOSIS — K581 Irritable bowel syndrome with constipation: Secondary | ICD-10-CM

## 2024-08-23 DIAGNOSIS — R1310 Dysphagia, unspecified: Secondary | ICD-10-CM | POA: Diagnosis not present

## 2024-08-23 DIAGNOSIS — K6289 Other specified diseases of anus and rectum: Secondary | ICD-10-CM

## 2024-08-23 DIAGNOSIS — K648 Other hemorrhoids: Secondary | ICD-10-CM

## 2024-08-23 DIAGNOSIS — K219 Gastro-esophageal reflux disease without esophagitis: Secondary | ICD-10-CM

## 2024-08-23 DIAGNOSIS — K644 Residual hemorrhoidal skin tags: Secondary | ICD-10-CM

## 2024-08-23 DIAGNOSIS — R140 Abdominal distension (gaseous): Secondary | ICD-10-CM | POA: Diagnosis not present

## 2024-08-23 MED ORDER — OMEPRAZOLE 40 MG PO CPDR: 40.0000 mg | DELAYED_RELEASE_CAPSULE | Freq: Two times a day (BID) | ORAL | 3 refills | Status: AC

## 2024-08-23 MED ORDER — HYDROCORTISONE (PERIANAL) 2.5 % EX CREA: 1.0000 | TOPICAL_CREAM | Freq: Three times a day (TID) | CUTANEOUS | 1 refills | Status: AC

## 2024-08-23 MED ORDER — PSYLLIUM 95 % PO POWD: ORAL | 1 refills | Status: AC

## 2024-08-23 MED ORDER — LIDOCAINE (ANORECTAL) 5 % EX GEL: CUTANEOUS | 1 refills | Status: AC

## 2024-08-23 NOTE — Telephone Encounter (Signed)
 Please send referral for pelvic floor PT. Dx: IBS with constipation, straining.

## 2024-08-23 NOTE — Patient Instructions (Addendum)
 For rectal bleeding and pain/hemorrhoids (possible anal fissure): Avoid straining Keep stools soft Apply the Anusol cream as well as the rectal lidocaine  to the external anal canal and internally at least 3 times a day for a week and then use as needed. If no improvement with this regimen or the compound cream then we will likely need to consider colonoscopy for further evaluation. After 1 week if you are still having rectal bleeding and pain please let me know and I will send in the compound cream and apothecary cream.  Please either call the office or send us  a MyChart message, MyChart message will be the fastest way.  For constipation I want you to ensure that you are taking at least 1 tablespoon of Metamucil or Benefiber daily in 6-8 ounces of water.  I sent in generic Metamucil to the pharmacy for you.  If insurance will not cover and is not affordable for you please look into the generic options or start taking MiraLAX half a capful or 1 capful daily or every other day to avoid diarrhea but have more consistent bowel movements.  For reflux and upper abdominal pain: Use omeprazole  40 mg twice daily.  For now take 2 of your 20 mg tablets twice daily and I have sent a refill to [facility] for you for the 40 mg capsules and you will take 1 of those twice daily.  Please let us  know if there is any issue with this.  The start date should be right when you are going to run out of your current supply. Follow a GERD diet:  Avoid fried, fatty, greasy, spicy, citrus foods. Avoid caffeine and carbonated beverages. Avoid chocolate. Try eating 4-6 small meals a day rather than 3 large meals. Do not eat within 3 hours of laying down. Prop head of bed up on wood or bricks to create a 6 inch incline.  Follow-up in 4 weeks.  It was a pleasure to see you today. I want to create trusting relationships with patients. If you receive a survey regarding your visit,  I greatly appreciate you taking time  to fill this out on paper or through your MyChart. I value your feedback.  Charmaine Melia, MSN, FNP-BC, AGACNP-BC [facility]

## 2024-08-23 NOTE — Progress Notes (Signed)
 GI Office Note    Referring Provider: Carlette Benita Area* Primary Care Physician:  [name], [name], MD Primary Gastroenterologist: Carlin POUR. Cindie, DO  Date:  08/23/2024  ID:  Emily Vasquez, DOB 11/26/1980, MRN [id number]   Chief Complaint   Chief Complaint  Patient presents with   Rectal Bleeding    Having bright red blood with stools. Thinks its hemorrhoid. Lots of heart burn. Stomach hurts    History of Present Illness  Emily Vasquez is a 43 y.o. female with a history of hypertension, ADHD, prior alcohol dependence, COPD, tobacco abuse, cervical disc disease, polyneuropathy, anxiety, HLD, GERD, and obesity presenting today with complaint of rectal bleeding, epigastric pain, and GERD.   During office visit 3/17 with PCP she reported anxiety improved with current treatment plan.  Amatory with assistance due to shakiness and tremor at times.  Working on trying to lose weight.   Most recent labs 01/02/2023: Platelets 275, hematocrit 40.4, hemoglobin 13.9.  Hep B surface antigen nonreactive, hep C antibody non-reactive.  Initial office visit 01/26/2024.  She reported that since age 24 she had been dealing with IBS as well as acid reflux.  Does admit to frequent Advil and NSAIDs.  Having tightness in the epigastric region as well as sharp pains and heaviness in her chest.  Denied family history of celiac.  Dairy products are a trigger for her.  Notes some occasional dysphagia but felt as though related to anxiety.  Also having asthma and recent hoarse voice.  Meprazole has helped some in the past and at times has reflux even if she coughs or breathes heavy.  Having 5-6 days without a bowel movement  EGD May 2025: - Small hiatal hernia.  - Mild Schatzki ring. Dilated.  - Gastritis. Biopsied.  - Normal duodenal bulb, first portion of the duodenum and second portion of the duodenum. - Repeat EGD as needed.  Today:  Discussed the use of AI scribe software for clinical  note transcription with the patient, who gave verbal consent to proceed.  She has been experiencing severe rectal pain and bleeding for approximately one week. The pain is described as stabbing and is localized more on the left side. It is accompanied by bright red blood in her stool and spotting on her underwear. She feels lightheaded during bowel movements if straining. Her bowel movements are soft, yet she experiences straining. She has a history of internal and external hemorrhoids, which she feels are inflamed, particularly on the left side. She has not used any hemorrhoid creams or Tux pads recently, only warm baths and Vaseline to alleviate symptoms. Despite these measures, the symptoms have persisted longer than usual, causing significant discomfort and concern.  She ran out of her Prilosec medication two weeks ago, which she had been doubling up on. Since then, she has experienced severe heartburn and has been consuming antacids frequently. She describes a constant burning sensation in her upper abdomen, which worsens when she uses the bathroom. She recently refilled her omeprazole .  She has a history of IBS, which causes irregular bowel habits, alternating between constipation and diarrhea. She has not been able to afford Metamucil, which was previously recommended to her, and has been trying to manage her symptoms with dietary adjustments, such as increasing fiber intake and reducing cheese consumption.      Wt Readings from Last 6 Encounters:  08/23/24 277 lb (125.6 kg)  02/15/24 240 lb (108.9 kg)  02/10/24 240 lb (108.9 kg)  01/26/24 249 lb 12.8  oz (113.3 kg)  03/11/23 259 lb (117.5 kg)  02/25/23 259 lb (117.5 kg)    Body mass index is 38.63 kg/m.   Current Outpatient Medications  Medication Sig Dispense Refill   albuterol  (PROVENTIL ) (2.5 MG/3ML) 0.083% nebulizer solution Take 3 mLs (2.5 mg total) by nebulization every 6 (six) hours as needed for wheezing or shortness of breath.  75 mL 12   albuterol  (VENTOLIN  HFA) 108 (90 Base) MCG/ACT inhaler Inhale 1-2 puffs into the lungs every 6 (six) hours as needed for wheezing or shortness of breath. 1 each 0   budesonide -formoterol  (SYMBICORT ) 160-4.5 MCG/ACT inhaler Inhale 2 puffs into the lungs 2 (two) times daily. 1 each 12   DULoxetine (CYMBALTA) 30 MG capsule Take 30 mg by mouth 2 (two) times daily.     gabapentin  (NEURONTIN ) 300 MG capsule Take 300 mg by mouth daily.     hydrOXYzine  (ATARAX /VISTARIL ) 25 MG tablet Take 1 tablet (25 mg total) by mouth every 6 (six) hours as needed for itching. 20 tablet 0   levocetirizine (XYZAL ) 5 MG tablet Take 1 tablet (5 mg total) by mouth every evening. 30 tablet 1   lisinopril-hydrochlorothiazide (ZESTORETIC) 10-12.5 MG tablet Take 1 tablet by mouth every morning.     omeprazole  (PRILOSEC) 40 MG capsule Take 1 capsule (40 mg total) by mouth 2 (two) times daily. 60 capsule 11   sertraline (ZOLOFT) 100 MG tablet Take 100 mg by mouth daily.     tizanidine (ZANAFLEX) 2 MG capsule Take 2 mg by mouth daily.     No current facility-administered medications for this visit.    Past Medical History:  Diagnosis Date   ADD (attention deficit disorder)    Anxiety    Asthma    Degenerative disc disease, cervical    Fibromyalgia    GERD (gastroesophageal reflux disease)    Low back pain    Ovarian cyst    Ovarian torsion     Past Surgical History:  Procedure Laterality Date   ABDOMINAL HYSTERECTOMY Left 06/17/2017   Procedure: HYSTERECTOMY ABDOMINAL; LEFT SALPINGO OOPHORECTOMY;  Surgeon: Jayne Vonn DEL, MD;  Location: [facility];  Service: Gynecology;  Laterality: Left;   CESAREAN SECTION     ESOPHAGEAL DILATION N/A 02/15/2024   Procedure: DILATION, ESOPHAGUS;  Surgeon: Cindie Carlin POUR, DO;  Location: [facility];  Service: Endoscopy;  Laterality: N/A;   ESOPHAGOGASTRODUODENOSCOPY N/A 02/15/2024   Procedure: EGD (ESOPHAGOGASTRODUODENOSCOPY);  Surgeon: Cindie Carlin POUR, DO;  Location: [facility];  Service: Endoscopy;  Laterality: N/A;  930am, asa 3   HAND SURGERY     LAPAROSCOPIC SALPINGO OOPHERECTOMY Left 06/17/2017   Procedure: diagnostic laproscopy;  Surgeon: Jayne Vonn DEL, MD;  Location: [facility];  Service: Gynecology;  Laterality: Left;  decision to open at 1330   OOPHORECTOMY     TUBAL LIGATION      Family History  Problem Relation Age of Onset   Alcohol abuse Paternal Grandfather    Mental illness Maternal Grandmother    Diabetes Maternal Grandmother    Heart attack Maternal Grandfather    Heart attack Father    High Cholesterol Father    Diabetes Brother    Stroke Brother    Asthma Daughter    Asthma Son     Allergies as of 08/23/2024 - Review Complete 08/23/2024  Allergen Reaction Noted   Compazine [prochlorperazine maleate] Other (See Comments) 05/24/2017    Social History   Socioeconomic History   Marital status: Single  Spouse name: Not on file   Number of children: Not on file   Years of education: Not on file   Highest education level: Not on file  Occupational History   Not on file  Tobacco Use   Smoking status: Every Day    Current packs/day: 0.75    Average packs/day: 0.8 packs/day for 26.0 years (19.5 ttl pk-yrs)    Types: Cigarettes   Smokeless tobacco: Never  Vaping Use   Vaping status: Never Used  Substance and Sexual Activity   Alcohol use: Yes    Comment: occasional   Drug use: No   Sexual activity: Not Currently    Birth control/protection: Surgical    Comment: tubal  Other Topics Concern   Not on file  Social History Narrative   Not on file   Social Drivers of Health   Financial Resource Strain: Not on file  Food Insecurity: Not on file  Transportation Needs: Not on file  Physical Activity: Not on file  Stress: Not on file  Social Connections: Not on file    Review of Systems   Gen: Denies fever, chills, anorexia. Denies fatigue, weakness, weight loss.  CV: Denies chest pain, palpitations, syncope,  peripheral edema, and claudication. Resp: Denies dyspnea at rest, cough, wheezing, coughing up blood, and pleurisy. GI: See HPI Derm: Denies rash, itching, dry skin Psych: Denies depression, anxiety, memory loss, confusion. No homicidal or suicidal ideation.  Heme: Denies bruising, bleeding, and enlarged lymph nodes.  Physical Exam   BP 117/85 (BP Location: Right Arm, Patient Position: Sitting, Cuff Size: Large)   Pulse 89   Temp 98 F (36.7 C) (Temporal)   Ht 5' 11 (1.803 m)   Wt 277 lb (125.6 kg)   LMP 05/12/2017   BMI 38.63 kg/m   General:   Alert and oriented. No distress noted. Pleasant and cooperative.  Head:  Normocephalic and atraumatic. Eyes:  Conjuctiva clear without scleral icterus. Abdomen:  +BS, soft,  non-distended.  TTP to LLQ, epigastrium, mid right upper quadrant with negative Murphy sign.  No rebound or guarding. No HSM or masses noted.  Rectal: Chaperone: Charmaine Patch, CMA Discussed performing anoscopy however she began having rectal pain/discomfort with digital rectal exam therefore anoscopy not completed.  On digital rectal exam she had presence of external hemorrhoids noted at the 3:00 and 9 o'clock position as well as some mild erythematous rectal mucosa with likely presence of prior bleeding about the 12 o'clock position.  Internal examination revealed palpable hemorrhoid tissue to the left lateral column without any overt anal fissure appreciated but with throbbing pain to this region.  Unable to assess right posterior right anterior hemorrhoid columns on DRE given pain.  Neurologic:  Alert and  oriented x4 Psych:  Alert and cooperative. Normal mood and affect.  Assessment & Plan  Emily Vasquez is a 43 y.o. female presenting today with rectal bleeding and pain with constipation, and reflux.    Internal and external hemorrhoids with rectal bleeding and pain, possible anal fissure.  Presents with internal and external hemorrhoids, primarily on the left  side, with significant pain and rectal bleeding for about a week. Bleeding is bright red, suggesting a lower source. Pain is severe, described as stabbing and burning, exacerbated by bowel movements. No thrombosis observed. Differential includes anal fissure due to pain severity and bleeding pattern. - Prescribed rectal lidocaine  for pain relief. - Prescribed Anusol (hydrocortisone cream) to reduce inflammation and promote healing. - Advised application of lidocaine  and Anusol to  the affected area, internally and externally - If no improvement within a week, will consider prescribing compound hemorrhoid cream with lidocaine , hydrocortisone, and nitroglycerin.  Gastroesophageal reflux disease (GERD) with epigastric pain and tenderness GERD with epigastric pain and tenderness, exacerbated by running out of omeprazole . Symptoms include heartburn and upper abdominal pain. Tenderness noted in the right upper quadrant and epigastrium, possibly related to GERD or other gastrointestinal issues. - Refilled omeprazole  prescription to 40 mg BID (40 mg capsule) and instructed for now to take two of ehr 20 mg tablets twice a day until the new prescription is ready. - Monitor for improvement in symptoms with resumption of omeprazole .  Irritable bowel syndrome with constipation IBS with constipation, characterized by irregular bowel movements and straining. Symptoms include alternating constipation and diarrhea, with recent exacerbation possibly due to dietary factors and lack of fiber intake. Difficulty affording Metamucil noted. - Prescribed psyllium fiber supplement to improve bowel regularity. - Advised adjusting fiber intake to every other day if diarrhea occurs. - Will consider Miralax if psyllium is not effective or affordable.  Possible Pelvic floor dysfunction Contributing to difficulty with bowel movements, requiring straining and specific body positioning. Symptoms may be exacerbated by childbirth and  lack of pelvic floor muscle coordination. - Offered referral to pelvic floor physical therapy for strengthening exercises.      Follow up   Follow up 4 weeks.   Charmaine Melia, MSN, FNP-BC, AGACNP-BC [facility]

## 2024-08-24 ENCOUNTER — Other Ambulatory Visit: Payer: Self-pay | Admitting: *Deleted

## 2024-08-24 DIAGNOSIS — K581 Irritable bowel syndrome with constipation: Secondary | ICD-10-CM

## 2024-08-24 NOTE — Telephone Encounter (Signed)
 Referral sent

## 2024-09-06 ENCOUNTER — Encounter: Payer: Self-pay | Admitting: Gastroenterology
